# Patient Record
Sex: Female | Born: 1990 | Race: White | Hispanic: No | Marital: Married | Smoking: Never smoker
Health system: Southern US, Community
[De-identification: ages and names within clinical notes are randomized; demographics above are authoritative.]

## PROBLEM LIST (undated history)

## (undated) DIAGNOSIS — Z789 Other specified health status: Secondary | ICD-10-CM

## (undated) HISTORY — PX: KNEE SURGERY: SHX244

## (undated) HISTORY — PX: ANTERIOR CRUCIATE LIGAMENT REPAIR: SHX115

---

## 2013-11-15 ENCOUNTER — Emergency Department (HOSPITAL_COMMUNITY)
Admission: EM | Admit: 2013-11-15 | Discharge: 2013-11-15 | Disposition: A | Discharge status: Home / Self Care | Payer: BC Managed Care – PPO | Source: Home / Self Care | Attending: Emergency Medicine | Admitting: Emergency Medicine

## 2013-11-15 ENCOUNTER — Encounter (HOSPITAL_COMMUNITY): Payer: Self-pay | Admitting: Emergency Medicine

## 2013-11-15 DIAGNOSIS — R197 Diarrhea, unspecified: Secondary | ICD-10-CM

## 2013-11-15 NOTE — ED Provider Notes (Signed)
Medical screening examination/treatment/procedure(s) were performed by resident physician or non-physician practitioner and as supervising physician I was immediately available for consultation/collaboration.  Erin Honig,Randal Buba MD     Charm RingsErin J Honig, MD 11/15/13 2128

## 2013-11-15 NOTE — ED Notes (Signed)
C/o diarrhea for 2 weeks.  Reports "something moving in stool" patient concerned she has a "tapeworm".  Patient concerned about a 3 pound weight loss in a week.  Abdominal pain for 2 weeks, sharp, brief "very quick " pain

## 2013-11-15 NOTE — Discharge Instructions (Signed)

## 2013-11-15 NOTE — ED Provider Notes (Addendum)
CSN: [id number]     Arrival date & time 11/15/13  1928 History   First MD Initiated Contact with Patient 11/15/13 2046     Chief Complaint  Patient presents with  . Diarrhea   (Consider location/radiation/quality/duration/timing/severity/associated sxs/prior Treatment) HPI Comments: 23 year old female presents for evaluation of diarrhea. For 2 weeks, she has had watery diarrhea times daily, and intermittent sharp brief abdominal pains. She finally decided to come in today because she states she saw something moving in her stool, like some sort of worm. She also has noted a 3 pound weight loss this week. She denies any blood in the diarrhea. She has no recent travel or unclean water sources. She has not traveled out of the country in 2 years. Denies any other symptoms. No vaginal discharge or pelvic pain.  No possibility of of pregnancy, she is abstinent. She does admit to increased stress with recently moving, she questions whether that may be causing her symptoms.   Patient is a 23 y.o. female presenting with diarrhea.  Diarrhea Associated symptoms: abdominal pain   Associated symptoms: no chills, no fever and no vomiting     History reviewed. No pertinent past medical history. Past Surgical History  Procedure Laterality Date  . Anterior cruciate ligament repair     No family history on file. History  Substance Use Topics  . Smoking status: Not on file  . Smokeless tobacco: Not on file  . Alcohol Use: Not on file   OB History   Grav Para Term Preterm Abortions TAB SAB Ect Mult Living                 Review of Systems  Constitutional: Positive for unexpected weight change. Negative for fever and chills.  Gastrointestinal: Positive for abdominal pain and diarrhea. Negative for nausea, vomiting and blood in stool.  All other systems reviewed and are negative.   Allergies  Review of patient's allergies indicates no known allergies.  Home Medications   Prior to Admission  medications   Medication Sig Start Date End Date Taking? Authorizing Provider  Multiple Vitamin (MULTIVITAMIN) tablet Take 1 tablet by mouth daily.   Yes Historical Provider, MD  Norethindrone Acet-Ethinyl Est (JUNEL 1/20 PO) Take by mouth.   Yes Historical Provider, MD   BP 123/74  Pulse 56  Temp(Src) 98.9 F (37.2 C) (Oral)  Resp 16  LMP 10/25/2013 Physical Exam  Nursing note and vitals reviewed. Constitutional: She is oriented to person, place, and time. Vital signs are normal. She appears well-developed and well-nourished. No distress.  HENT:  Head: Normocephalic and atraumatic.  Cardiovascular: Normal rate, regular rhythm and normal heart sounds.   Pulmonary/Chest: Effort normal. No respiratory distress.  Abdominal: Soft. Normal appearance and bowel sounds are normal. She exhibits no distension, no pulsatile liver, no fluid wave and no mass. There is no hepatosplenomegaly. There is no tenderness. There is no rigidity, no rebound, no guarding, no CVA tenderness, no tenderness at McBurney's point and negative Murphy's sign. No hernia.  Neurological: She is alert and oriented to person, place, and time. She has normal strength. Coordination normal.  Skin: Skin is warm and dry. No rash noted. She is not diaphoretic.  Psychiatric: She has a normal mood and affect. Judgment normal.    ED Course  Procedures (including critical care time) Labs Review Labs Reviewed - No data to display  Imaging Review No results found.   MDM   1. Diarrhea    Benign exam. Will send stool studies  prior to treating, she will bring the studies back to the lab tomorrow. Followup when necessary       Graylon GoodZachary H Bj Morlock, PA-C 11/15/13 2105   Labs came back positive for Salmonella, patient had Cipro called in. She'll followup as needed.  Graylon GoodZachary H Anjalee Cope, PA-C 11/22/13 [phone]

## 2013-11-17 LAB — GI PATHOGEN PANEL BY PCR, STOOL
C difficile toxin A/B: NEGATIVE
Campylobacter by PCR: NEGATIVE
Cryptosporidium by PCR: NEGATIVE
E COLI (ETEC) LT/ST: NEGATIVE
E COLI 0157 BY PCR: NEGATIVE
E coli (STEC): NEGATIVE
G lamblia by PCR: NEGATIVE
Norovirus GI/GII: NEGATIVE
Rotavirus A by PCR: NEGATIVE
SALMONELLA BY PCR: POSITIVE
SHIGELLA BY PCR: NEGATIVE

## 2013-11-17 LAB — FECAL LACTOFERRIN, QUANT: Fecal Lactoferrin: POSITIVE

## 2013-11-17 LAB — OVA AND PARASITE EXAMINATION: Ova and parasites: NONE SEEN

## 2013-11-20 ENCOUNTER — Telehealth (HOSPITAL_COMMUNITY): Payer: Self-pay | Admitting: *Deleted

## 2013-11-20 NOTE — ED Notes (Addendum)
GI Pathogen panel: pos. Salmonella, Fecal Lactoferrin pos., Ova and Parasite: neg., Stool culture pending. 8/23 Lab reviewed.  Message sent to Dr. Piedad Climes and Almedia Balls PA.  8/24  I called Dr. Lorenz Coaster at [facility] in ED and obtained order for Cipro 500 mg. po BID x 10 days #20.  I called pt. and left a message to call. Call 1. Deanna Maxwell 11/20/2013 Pt. called back.  Pt. verified x 2 and given results.  Pt. told she needs Cipro and how to take it.  Pt. wants Rx. [facility] on [address]  I told her I would call it in and it should be ready in about 1 hr.  I told her, it should clear up, but if worsening in any way to f/u at the ED, since we are closed for 1 week.  I told her I would be reporting it to the [facility] and thye would probably call her back.  Pt. voiced understanding. Rx. called to pharmacist.  DHHS form completed and faxed to the [facility] Department. Deanna Maxwell 11/20/2013

## 2013-11-22 NOTE — ED Provider Notes (Signed)
Medical screening examination/treatment/procedure(s) were performed by resident physician or non-physician practitioner and as supervising physician I was immediately available for consultation/collaboration.  Randal Buba, MD     Charm Rings, MD 11/22/13 [phone]

## 2013-12-25 ENCOUNTER — Emergency Department (INDEPENDENT_AMBULATORY_CARE_PROVIDER_SITE_OTHER)
Admission: EM | Admit: 2013-12-25 | Discharge: 2013-12-25 | Disposition: A | Discharge status: Home / Self Care | Payer: BC Managed Care – PPO | Source: Home / Self Care | Attending: Emergency Medicine | Admitting: Emergency Medicine

## 2013-12-25 ENCOUNTER — Encounter (HOSPITAL_COMMUNITY): Payer: Self-pay | Admitting: Emergency Medicine

## 2013-12-25 DIAGNOSIS — A020 Salmonella enteritis: Secondary | ICD-10-CM

## 2013-12-25 MED ORDER — CIPROFLOXACIN HCL 500 MG PO TABS: 500.0000 mg | ORAL_TABLET | Freq: Two times a day (BID) | ORAL | Status: DC

## 2013-12-25 NOTE — ED Notes (Signed)
Patient concerned about poss continued salmonella infection

## 2013-12-25 NOTE — ED Provider Notes (Signed)
  Chief Complaint   Abdominal Pain    History of Present Illness   Deanna Maxwell is a 23 year old female who was treated for abdominal pain and diarrhea around the end of August. Her stool culture grew grew out Salmonella. She's not sure where she got it from, but states she eats a lot of cucumbers and this may have been the source. She was contacted by the health department but never did respond to them. She was treated with 10 days of ciprofloxacin and then got better. Over the past 4 days she's again developed some sensation of tenesmus and some mucousy stools. She denies any fever, chills, generalized aching, headache, abdominal pain, cramping, nausea, or vomiting.  Review of Systems   Other than as noted above, the patient denies any of the following symptoms: Systemic:  No fevers, chills, or dizziness. GI:  Blood in stool or vomitus. GU:  No dysuria, frequency, or urgency.  PMFSH   Past medical history, family history, social history, meds, and allergies were reviewed.    Physical Exam     Vital signs:  BP 135/79  Pulse 58  Temp(Src) 98.7 F (37.1 C) (Oral)  Resp 16  SpO2 99% General:  Alert and oriented.  In no distress.  Skin warm and dry.  Good skin turgor, brisk capillary refill. ENT:  No scleral icterus, moist mucous membranes, no oral lesions, pharynx clear. Lungs:  Breath sounds clear and equal bilaterally.  No wheezes, rales, or rhonchi. Heart:  Rhythm regular, without extrasystoles.  No gallops or murmers. Abdomen:  Soft and nontender without organomegaly or mass. Bowel sounds are normally active. Skin: Clear, warm, and dry.  Good turgor.  Brisk capillary refill.   Assessment   The encounter diagnosis was Salmonella gastroenteritis.  She may never have gotten completely rid of the Salmonella, or she may have been reinfected. A repeat stool culture was obtained and will retreat with 15 days of Cipro, since the Salmonella was sensitive to this.  Plan   1.   Meds:  The following meds were prescribed:   Discharge Medication List as of 12/25/2013 12:29 PM    START taking these medications   Details  ciprofloxacin (CIPRO) 500 MG tablet Take 1 tablet (500 mg total) by mouth every 12 (twelve) hours., Starting 12/25/2013, Until Discontinued, Normal        2.  Patient Education/Counseling:  The patient was given appropriate handouts, self care instructions, and instructed in symptomatic relief. The patient was told to stay on clear liquids for the remainder of the day, then advance to a B.R.A.T. diet starting tomorrow.   3.  Follow up:  The patient was told to follow up here if no better in 2 to 3 days, or sooner if becoming worse in any way, and given some red flag symptoms such as persistent vomitng, high fever, severe abdominal pain, or any GI bleeding which would prompt immediate return.         Reuben Likes, MD 12/25/13 [phone]

## 2013-12-25 NOTE — Discharge Instructions (Signed)
Salmonella Gastroenteritis Salmonella gastroenteritis occurs when certain bacteria infect the intestines. People usually begin to feel ill within 72 hours after the infection occurs. The illness can last from 2 days to 2 weeks. Elderly and immunocompromised people are at the greatest risk of this infection. Most people recover completely. However, salmonella bacteria can spread from the intestines to the blood and other parts of the body. In rare cases, a person may develop reactive arthritis with pain in the joints, irritation of the eyes, and painful urination. CAUSES  Salmonella gastroenteritis usually occurs after eating food or drinking liquids that are contaminated with salmonella bacteria. Common causes of this contamination include:  Poor personal hygiene.  Poor kitchen hygiene.  Drinking polluted, standing water.  Contact with carriers of the bacteria. Reptiles are strongly associated with the bacteria, but other animals may carry the bacteria as well. SIGNS AND SYMPTOMS   Nausea.  Vomiting.  Abdominal pain or cramps.  Diarrhea, which may be bloody.  Fever.  Headache. DIAGNOSIS  Your health care provider will take your medical history and perform a physical exam. A blood or stool sample may also be taken and tested for the presence of salmonella bacteria. TREATMENT  Often, no treatment is needed. However, you will need to drink plenty of fluids to prevent dehydration. In severe cases, antibiotic medicines may be given to help shorten the illness. HOME CARE INSTRUCTIONS  Drink enough fluids to keep your urine clear or pale yellow. Until your diarrhea, nausea, or vomiting is under control, you should only drink clear liquids. Clear liquids are anything you can see through, such as water, broth, or non-caffeinated tea. Avoid:  Milk.  Fruit juice.  Alcohol.  Extremely hot or cold fluids.  If you do not have an appetite, do not force yourself to eat. However, you must  continue to drink fluids.  If you have an appetite, eat a normal diet unless your health care provider tells you differently.  Eat a variety of complex carbohydrates (rice, wheat, potatoes, bread), lean meats, yogurt, fruits, and vegetables.  Avoid high-fat foods because they are more difficult to digest.  If you are dehydrated, ask your health care provider for specific rehydration instructions. Signs of dehydration may include:  Severe thirst.  Dry lips and mouth.  Dizziness.  Dark urine.  Decreasing urine frequency and amount.  Confusion.  Rapid breathing or pulse.  If you were prescribed an antibiotic medicine, finish it all even if you start to feel better.  Take medicines only as directed by your health care provider. Antidiarrheal medicines are not recommended.  Keep all follow-up visits as directed by your health care provider. PREVENTION  To prevent future salmonella infections:  Handle meat, eggs, seafood, and poultry properly.  Wash your hands and counters thoroughly after handling or preparing meat, eggs, seafood, and poultry.  Always cook meat, eggs, seafood, and poultry thoroughly.  Wash your hands thoroughly after handling animals. SEEK IMMEDIATE MEDICAL CARE IF:   You are unable to keep fluids down.  You have persistent vomiting or diarrhea.  You have abdominal pain that increases or is concentrated in one small area (localized).  Your diarrhea contains increased blood or mucus.  You feel very weak, dizzy, thirsty, or you faint.  You lose a significant amount of weight. Your health care provider can tell you how much weight loss should concern you.  You have a fever. MAKE SURE YOU:   Understand these instructions.  Will watch your condition.  Will get help   right away if you are not doing well or get worse. Document Released: 03/13/2000 Document Revised: 07/31/2013 Document Reviewed: 05/14/2011 ExitCare Patient Information 2015 ExitCare,  LLC. This information is not intended to replace advice given to you by your health care provider. Make sure you discuss any questions you have with your health care provider.  

## 2014-01-05 ENCOUNTER — Emergency Department (HOSPITAL_COMMUNITY)
Admission: EM | Admit: 2014-01-05 | Discharge: 2014-01-05 | Disposition: A | Discharge status: Home / Self Care | Payer: BC Managed Care – PPO | Source: Home / Self Care | Attending: Family Medicine | Admitting: Family Medicine

## 2014-01-05 ENCOUNTER — Encounter (HOSPITAL_COMMUNITY): Payer: Self-pay | Admitting: Emergency Medicine

## 2014-01-05 DIAGNOSIS — R109 Unspecified abdominal pain: Secondary | ICD-10-CM

## 2014-01-05 DIAGNOSIS — R195 Other fecal abnormalities: Secondary | ICD-10-CM

## 2014-01-05 MED ORDER — DICYCLOMINE HCL 10 MG PO CAPS: 10.0000 mg | ORAL_CAPSULE | Freq: Three times a day (TID) | ORAL | Status: DC

## 2014-01-05 NOTE — ED Provider Notes (Signed)
Deanna Maxwell is a 23 y.o. female who presents to Urgent Care today for abdominal cramping and nausea. Patient was diagnosed with Salmonella on August 19 and treated with Cipro. She was seen again on September 28 and was again treated with Cipro. She continues the medication yet she continues to have abdominal cramping and intermittent diarrhea with mucus. She denies any fevers or chills or significant abdominal pain. The initial stool culture was positive for Salmonella sensitive to Cipro. The patient did not collect a second stool culture at the time of the initial visit but does bring in one today and a urine specimen cup.   History reviewed. No pertinent past medical history. History  Substance Use Topics  . Smoking status: Never Smoker   . Smokeless tobacco: Not on file  . Alcohol Use: Yes   ROS as above Medications: No current facility-administered medications for this encounter.   Current Outpatient Prescriptions  Medication Sig Dispense Refill  . ciprofloxacin (CIPRO) 500 MG tablet Take 1 tablet (500 mg total) by mouth every 12 (twelve) hours.  30 tablet  0  . dicyclomine (BENTYL) 10 MG capsule Take 1 capsule (10 mg total) by mouth 3 (three) times daily before meals.  120 capsule  0  . Multiple Vitamin (MULTIVITAMIN) tablet Take 1 tablet by mouth daily.      . Norethindrone Acet-Ethinyl Est (JUNEL 1/20 PO) Take by mouth.        Exam:  BP 133/68  Pulse 68  Temp(Src) 98.3 F (36.8 C) (Oral)  Resp 16  Ht 5\' 4"  (1.626 m)  Wt 129 lb (58.514 kg)  BMI 22.13 kg/m2  SpO2 100%  LMP 12/22/2013 Gen: Well NAD HEENT: EOMI,  MMM Lungs: Normal work of breathing. CTABL Heart: RRR no MRG Abd: NABS, Soft. Nondistended, Nontender no rebound or guarding Exts: Brisk capillary refill, warm and well perfused.   No results found for this or any previous visit (from the past 24 hour(s)). No results found.  Assessment and Plan: 23 y.o. female with continued abdominal cramping and  discomfort. I am doubtful this is continued Salmonella. Plan to re-obtain a stool culture. I suspect IBS or possibly inflammatory bowel disease. Refer to gastroenterology. Treatment with dicyclomine  Discussed warning signs or symptoms. Please see discharge instructions. Patient expresses understanding.     Rodolph BongEvan S Brittney Caraway, MD 01/05/14 [phone]

## 2014-01-05 NOTE — Discharge Instructions (Signed)
Thank you for coming in today. Take dicyclomine up to 3 times daily as needed for abdominal cramping. [facility] and [facility]. If your belly pain worsens, or you have high fever, bad vomiting, blood in your stool or black tarry stool go to the Emergency Room.    Abdominal Pain, Women Abdominal (stomach, pelvic, or belly) pain can be caused by many things. It is important to tell your doctor:  The location of the pain.  Does it come and go or is it present all the time?  Are there things that start the pain (eating certain foods, exercise)?  Are there other symptoms associated with the pain (fever, nausea, vomiting, diarrhea)? All of this is helpful to know when trying to find the cause of the pain. CAUSES   Stomach: virus or bacteria infection, or ulcer.  Intestine: appendicitis (inflamed appendix), regional ileitis (Crohn's disease), ulcerative colitis (inflamed colon), irritable bowel syndrome, diverticulitis (inflamed diverticulum of the colon), or cancer of the stomach or intestine.  Gallbladder disease or stones in the gallbladder.  Kidney disease, kidney stones, or infection.  Pancreas infection or cancer.  Fibromyalgia (pain disorder).  Diseases of the female organs:  Uterus: fibroid (non-cancerous) tumors or infection.  Fallopian tubes: infection or tubal pregnancy.  Ovary: cysts or tumors.  Pelvic adhesions (scar tissue).  Endometriosis (uterus lining tissue growing in the pelvis and on the pelvic organs).  Pelvic congestion syndrome (female organs filling up with blood just before the menstrual period).  Pain with the menstrual period.  Pain with ovulation (producing an egg).  Pain with an IUD (intrauterine device, birth control) in the uterus.  Cancer of the female organs.  Functional pain (pain not caused by a disease, may improve without treatment).  Psychological pain.  Depression. DIAGNOSIS  Your doctor  will decide the seriousness of your pain by doing an examination.  Blood tests.  X-rays.  Ultrasound.  CT scan (computed tomography, special type of X-ray).  MRI (magnetic resonance imaging).  Cultures, for infection.  Barium enema (dye inserted in the large intestine, to better view it with X-rays).  Colonoscopy (looking in intestine with a lighted tube).  Laparoscopy (minor surgery, looking in abdomen with a lighted tube).  Major abdominal exploratory surgery (looking in abdomen with a large incision). TREATMENT  The treatment will depend on the cause of the pain.   Many cases can be observed and treated at home.  Over-the-counter medicines recommended by your caregiver.  Prescription medicine.  Antibiotics, for infection.  Birth control pills, for painful periods or for ovulation pain.  Hormone treatment, for endometriosis.  Nerve blocking injections.  Physical therapy.  Antidepressants.  Counseling with a psychologist or psychiatrist.  Minor or major surgery. HOME CARE INSTRUCTIONS   Do not take laxatives, unless directed by your caregiver.  Take over-the-counter pain medicine only if ordered by your caregiver. Do not take aspirin because it can cause an upset stomach or bleeding.  Try a clear liquid diet (broth or water) as ordered by your caregiver. Slowly move to a bland diet, as tolerated, if the pain is related to the stomach or intestine.  Have a thermometer and take your temperature several times a day, and record it.  Bed rest and sleep, if it helps the pain.  Avoid sexual intercourse, if it causes pain.  Avoid stressful situations.  Keep your follow-up appointments and tests, as your caregiver orders.  If the pain does not go away with medicine or surgery, you  may try:  Acupuncture.  Relaxation exercises (yoga, meditation).  Group therapy.  Counseling. SEEK MEDICAL CARE IF:   You notice certain foods cause stomach pain.  Your  home care treatment is not helping your pain.  You need stronger pain medicine.  You want your IUD removed.  You feel faint or lightheaded.  You develop nausea and vomiting.  You develop a rash.  You are having side effects or an allergy to your medicine. SEEK IMMEDIATE MEDICAL CARE IF:   Your pain does not go away or gets worse.  You have a fever.  Your pain is felt only in portions of the abdomen. The right side could possibly be appendicitis. The left lower portion of the abdomen could be colitis or diverticulitis.  You are passing blood in your stools (bright red or black tarry stools, with or without vomiting).  You have blood in your urine.  You develop chills, with or without a fever.  You pass out. MAKE SURE YOU:   Understand these instructions.  Will watch your condition.  Will get help right away if you are not doing well or get worse. Document Released: 01/11/2007 Document Revised: 07/31/2013 Document Reviewed: 01/31/2009 [facility] Patient Information 2015 [place], [place]. This information is not intended to replace advice given to you by your health care provider. Make sure you discuss any questions you have with your health care provider.  PRIMARY CARE Merchant navy officerDOCTORS  [facility] at [facility] [address]  [place], [place] [phone]  Fax [phone]  [facility] at [facility] [address]  [place], [place] [phone]  Fax [phone]  [facility] at [place] / [place] [id number]  [place], [place] [phone]  Fax [phone]  [facility] at [facility] [address], [address]  [place], [place] [phone]  Fax [phone]  [facility] [address]  [place], [place] [phone]  Fax [phone]  [facility] at [facility] [address]  [place], [place] [phone]  Fax [phone]   [facility] @ [place] [address] [place] [address] Phone: [phone]   [facility] @ [facility] [address] [place] [address] Phone: [phone]   [facility] @ [place] [address] [place] [address] Phone: [phone]   [facility] @ [place] [address] [place] [address] Phone: [phone]   [facility] @ [place] [address] [place] [address] Phone: [phone] Fax: [phone]   [facility] @ [place] [address] [place] [address] Phone: [phone]   Dr. Maryelizabeth RowanElizabeth Dewey [address] [place] [address] [phone]

## 2014-01-05 NOTE — ED Notes (Signed)
Coming in for persistent abd pain; Seen here on 8/19 and 9/28 Dx w/Solmenella; given cipro w/no relief Sx today include: HA, nauseas, LH, decreased appetite Denies f/v/d Alert, no signs of acute distress.

## 2014-01-09 ENCOUNTER — Encounter: Payer: Self-pay | Admitting: Gastroenterology

## 2014-01-09 LAB — STOOL CULTURE: Special Requests: NORMAL

## 2014-01-16 LAB — STOOL CULTURE

## 2014-01-16 NOTE — ED Notes (Signed)
Stool culture final: Salmonella Enteritiditis. Pt. notified and treated 11/20/2013

## 2014-02-15 ENCOUNTER — Other Ambulatory Visit: Payer: Self-pay | Admitting: Family Medicine

## 2014-02-15 DIAGNOSIS — R109 Unspecified abdominal pain: Secondary | ICD-10-CM

## 2014-02-16 ENCOUNTER — Other Ambulatory Visit: Payer: Self-pay | Admitting: Family Medicine

## 2014-02-16 DIAGNOSIS — R1010 Upper abdominal pain, unspecified: Secondary | ICD-10-CM

## 2014-02-19 ENCOUNTER — Ambulatory Visit
Admission: RE | Admit: 2014-02-19 | Discharge: 2014-02-19 | Disposition: A | Discharge status: Home / Self Care | Payer: BC Managed Care – PPO | Source: Ambulatory Visit | Attending: Family Medicine | Admitting: Family Medicine

## 2014-02-19 ENCOUNTER — Other Ambulatory Visit: Payer: BC Managed Care – PPO

## 2014-02-19 DIAGNOSIS — R1010 Upper abdominal pain, unspecified: Secondary | ICD-10-CM

## 2014-02-19 DIAGNOSIS — R109 Unspecified abdominal pain: Secondary | ICD-10-CM

## 2014-03-13 ENCOUNTER — Ambulatory Visit: Payer: BC Managed Care – PPO | Admitting: Gastroenterology

## 2017-06-28 ENCOUNTER — Other Ambulatory Visit: Payer: Self-pay

## 2017-06-28 ENCOUNTER — Ambulatory Visit (HOSPITAL_COMMUNITY)
Admission: EM | Admit: 2017-06-28 | Discharge: 2017-06-28 | Disposition: A | Discharge status: Home / Self Care | Payer: BLUE CROSS/BLUE SHIELD | Attending: Family Medicine | Admitting: Family Medicine

## 2017-06-28 ENCOUNTER — Encounter (HOSPITAL_COMMUNITY): Payer: Self-pay | Admitting: Emergency Medicine

## 2017-06-28 DIAGNOSIS — J0100 Acute maxillary sinusitis, unspecified: Secondary | ICD-10-CM

## 2017-06-28 MED ORDER — AMOXICILLIN-POT CLAVULANATE 875-125 MG PO TABS: 1.0000 | ORAL_TABLET | Freq: Two times a day (BID) | ORAL | 0 refills | Status: AC

## 2017-06-28 NOTE — ED Provider Notes (Signed)
[facility]    CSN: [id number] Arrival date & time: 06/28/17  1810     History   Chief Complaint Chief Complaint  Patient presents with  . URI    HPI Deanna Maxwell is a 27 y.o. female.   Deanna Maxwell presents with complaints of worsening facial pressure and congestion which started two weeks ago and is worse today. Has been taking otc sinus medications which had been helping some. No known fevers. Denies sore throat or ear pain. Mild nausea. Pressure to face, forehead, posterior head. Symptoms are worse with laying and with movement. Without contributing medical history.   ROS per HPI.      History reviewed. No pertinent past medical history.  There are no active problems to display for this patient.   Past Surgical History:  Procedure Laterality Date  . ANTERIOR CRUCIATE LIGAMENT REPAIR    . KNEE SURGERY      OB History   None      Home Medications    Prior to Admission medications   Medication Sig Start Date End Date Taking? Authorizing Provider  amoxicillin-clavulanate (AUGMENTIN) 875-125 MG tablet Take 1 tablet by mouth every 12 (twelve) hours for 10 days. 06/28/17 07/08/17  Georgetta Haber, NP  dicyclomine (BENTYL) 10 MG capsule Take 1 capsule (10 mg total) by mouth 3 (three) times daily before meals. 01/05/14   Rodolph Bong, MD  Multiple Vitamin (MULTIVITAMIN) tablet Take 1 tablet by mouth daily.    [provider]    Family History Family History  Problem Relation Age of Onset  . Healthy Mother     Social History Social History   Tobacco Use  . Smoking status: Never Smoker  Substance Use Topics  . Alcohol use: Yes  . Drug use: Never     Allergies   Patient has no known allergies.   Review of Systems Review of Systems   Physical Exam Triage Vital Signs ED Triage Vitals  Enc Vitals Group     BP 06/28/17 1918 130/87     Pulse Rate 06/28/17 1918 60     Resp 06/28/17 1918 20     Temp 06/28/17 1918 (!) 97.2 F  (36.2 C)     Temp Source 06/28/17 1918 Oral     SpO2 06/28/17 1918 100 %     Weight --      Height --      Head Circumference --      Peak Flow --      Pain Score 06/28/17 1916 2     Pain Loc --      Pain Edu? --      Excl. in GC? --    No data found.  Updated Vital Signs BP 130/87 (BP Location: Left Arm)   Pulse 60   Temp (!) 97.2 F (36.2 C) (Oral)   Resp 20   LMP 06/22/2017   SpO2 100%   Visual Acuity Right Eye Distance:   Left Eye Distance:   Bilateral Distance:    Right Eye Near:   Left Eye Near:    Bilateral Near:     Physical Exam  Constitutional: She is oriented to person, place, and time. She appears well-developed and well-nourished. No distress.  HENT:  Head: Normocephalic and atraumatic.  Right Ear: Tympanic membrane, external ear and ear canal normal.  Left Ear: Tympanic membrane, external ear and ear canal normal.  Nose: Mucosal edema present. Right sinus exhibits maxillary sinus tenderness. Right sinus exhibits no  frontal sinus tenderness. Left sinus exhibits maxillary sinus tenderness. Left sinus exhibits no frontal sinus tenderness.  Mouth/Throat: Uvula is midline, oropharynx is clear and moist and mucous membranes are normal. No tonsillar exudate.  Eyes: Pupils are equal, round, and reactive to light. Conjunctivae and EOM are normal.  Cardiovascular: Normal rate, regular rhythm and normal heart sounds.  Pulmonary/Chest: Effort normal and breath sounds normal.  Neurological: She is alert and oriented to person, place, and time.  Skin: Skin is warm and dry.     UC Treatments / Results  Labs (all labs ordered are listed, but only abnormal results are displayed) Labs Reviewed - No data to display  EKG None Radiology No results found.  Procedures Procedures (including critical care time)  Medications Ordered in UC Medications - No data to display   Initial Impression / Assessment and Plan / UC Course  I have reviewed the triage vital  signs and the nursing notes.  Pertinent labs & imaging results that were available during my care of the patient were reviewed by me and considered in my medical decision making (see chart for details).     Complete course of antibiotics.  Push fluids to ensure adequate hydration and keep secretions thin.  Tylenol and/or ibuprofen as needed for pain or fevers.  Return precautions provided. Patient verbalized understanding and agreeable to plan.    Final Clinical Impressions(s) / UC Diagnoses   Final diagnoses:  Acute non-recurrent maxillary sinusitis    ED Discharge Orders        Ordered    amoxicillin-clavulanate (AUGMENTIN) 875-125 MG tablet  Every 12 hours     06/28/17 1942       Controlled Substance Prescriptions [place] Controlled Substance Registry consulted? Not Applicable   Georgetta HaberBurky, Natalie B, NP 06/28/17 [phone]

## 2017-06-28 NOTE — Discharge Instructions (Signed)
Push fluids to ensure adequate hydration and keep secretions thin.  Tylenol and/or ibuprofen as needed for pain or fevers.  Complete course of antibiotics.  May continue with over the counter treatments as needed for symptoms.  If symptoms worsen or do not improve in the next week to return to be seen or to follow up with your PCP.

## 2017-06-28 NOTE — ED Triage Notes (Signed)
Onset of symptoms over the past 2 weeks.  Thinks sinus pressure related to weather changes.  Intermittent light headed.

## 2017-09-26 ENCOUNTER — Ambulatory Visit (HOSPITAL_COMMUNITY)
Admission: EM | Admit: 2017-09-26 | Discharge: 2017-09-26 | Disposition: A | Discharge status: Home / Self Care | Payer: BLUE CROSS/BLUE SHIELD | Attending: Family Medicine | Admitting: Family Medicine

## 2017-09-26 ENCOUNTER — Encounter (HOSPITAL_COMMUNITY): Payer: Self-pay | Admitting: Emergency Medicine

## 2017-09-26 DIAGNOSIS — R079 Chest pain, unspecified: Secondary | ICD-10-CM

## 2017-09-26 NOTE — Discharge Instructions (Signed)
Rest Limit caffeine Take some ibuprofen for back pain, see if this helps Return promptly for any worsening or change in the pain

## 2017-09-26 NOTE — ED Triage Notes (Signed)
Pt c/o center chest pain, breathing makes it worse. Turning to the side also makes it worse.

## 2017-09-26 NOTE — ED Provider Notes (Signed)
[facility]    CSN: [id number] Arrival date & time: 09/26/17  1231     History   Chief Complaint Chief Complaint  Patient presents with  . Chest Pain    HPI Stewart Pimenta is a 27 y.o. female.   HPI Chest pain started at 10:30 last night.  Pressure in the center and left of the chest.  NO shortness of breath or diaphoresis, vague dizziness, no palpitations.  No radiation.  NO trauma.  NO GERD or nausea/vomiting/heartburn.  Usually healthy.  NO HTN, cholesterol, DM or smoking.  Family history of heart disease on fathers side of family, in older folks.  No heart history or murmur in past. No recent illness or cough.  No underlying asthma or allergies No trauma or heavy lifting She denies emotional stress, however appears anxious and is emotionally labile with frequent tearfulness during our interaction. History reviewed. No pertinent past medical history.  There are no active problems to display for this patient.   Past Surgical History:  Procedure Laterality Date  . ANTERIOR CRUCIATE LIGAMENT REPAIR    . KNEE SURGERY      OB History   None      Home Medications    Prior to Admission medications   Medication Sig Start Date End Date Taking? Authorizing Provider  dicyclomine (BENTYL) 10 MG capsule Take 1 capsule (10 mg total) by mouth 3 (three) times daily before meals. 01/05/14   Rodolph Bong, MD  Multiple Vitamin (MULTIVITAMIN) tablet Take 1 tablet by mouth daily.    [provider]    Family History Family History  Problem Relation Age of Onset  . Healthy Mother   . Cancer Father   . Hypertension Father   . Heart disease Other     Social History Social History   Tobacco Use  . Smoking status: Never Smoker  Substance Use Topics  . Alcohol use: Yes  . Drug use: Never     Allergies   Patient has no known allergies.   Review of Systems Review of Systems  Constitutional: Negative for chills, fatigue and fever.  HENT: Negative  for ear pain and sore throat.   Eyes: Negative for pain and visual disturbance.  Respiratory: Negative for cough and shortness of breath.   Cardiovascular: Positive for chest pain. Negative for palpitations and leg swelling.  Gastrointestinal: Negative for abdominal pain, nausea and vomiting.  Genitourinary: Negative for dysuria and hematuria.  Musculoskeletal: Negative for arthralgias and back pain.  Skin: Negative for color change and rash.  Neurological: Negative for seizures and syncope.  Psychiatric/Behavioral: Negative for dysphoric mood. The patient is nervous/anxious.        Has had mild anxiety but not recently.  Denies stress  All other systems reviewed and are negative.    Physical Exam Triage Vital Signs ED Triage Vitals  Enc Vitals Group     BP 09/26/17 1324 124/69     Pulse Rate 09/26/17 1324 93     Resp 09/26/17 1324 18     Temp 09/26/17 1324 98.5 F (36.9 C)     Temp src --      SpO2 09/26/17 1324 100 %     Weight --      Height --      Head Circumference --      Peak Flow --      Pain Score 09/26/17 1325 5     Pain Loc --      Pain Edu? --  Excl. in GC? --    No data found.  Updated Vital Signs BP 124/69   Pulse 93   Temp 98.5 F (36.9 C)   Resp 18   LMP 09/23/2017   SpO2 100%      Physical Exam  Constitutional: She appears well-developed and well-nourished. No distress.  HENT:  Head: Normocephalic and atraumatic.  Mouth/Throat: Oropharynx is clear and moist.  Eyes: Pupils are equal, round, and reactive to light. Conjunctivae are normal.  Neck: Normal range of motion.  Cardiovascular: Normal rate and normal pulses. An irregularly irregular rhythm present.    No systolic murmur is present. Pulmonary/Chest: Effort normal and breath sounds normal. No respiratory distress.  No chest wall tenderness  Abdominal: Soft. Bowel sounds are normal. She exhibits no distension. There is no tenderness.  Musculoskeletal: Normal range of motion. She  exhibits no edema.       Right lower leg: Normal.       Left lower leg: Normal.  Neurological: She is alert.  Skin: Skin is warm and dry.  Psychiatric: Her behavior is normal. Her affect is labile.     UC Treatments / Results  Labs (all labs ordered are listed, but only abnormal results are displayed) Labs Reviewed - No data to display  EKG None  Radiology No results found.  Procedures Procedures (including critical care time)  Medications Ordered in UC Medications - No data to display  Initial Impression / Assessment and Plan / UC Course  I have reviewed the triage vital signs and the nursing notes.  Pertinent labs & imaging results that were available during my care of the patient were reviewed by me and considered in my medical decision making (see chart for details).     Discussed with patient and her husband the differential diagnosis of chest pain.  Her pain does not have any history or examination features of heart related pain.  Lungs are clear.  No lung disease.  No chest wall pain.  She does have some history of thoracic back pain that is muscular in nature.  This could contribute.  I believe her anxiety and stress are contributory. Final Clinical Impressions(s) / UC Diagnoses   Final diagnoses:  Chest pain, unspecified type     Discharge Instructions     Rest Limit caffeine Take some ibuprofen for back pain, see if this helps Return promptly for any worsening or change in the pain    ED Prescriptions    None     Controlled Substance Prescriptions [place] Controlled Substance Registry consulted? Not Applicable   Eustace MooreNelson, Aaleeyah Bias Sue, MD 09/26/17 2206

## 2017-10-04 ENCOUNTER — Encounter (HOSPITAL_COMMUNITY): Payer: Self-pay | Admitting: Emergency Medicine

## 2017-10-04 ENCOUNTER — Emergency Department (HOSPITAL_COMMUNITY): Payer: BLUE CROSS/BLUE SHIELD

## 2017-10-04 ENCOUNTER — Emergency Department (HOSPITAL_COMMUNITY)
Admission: EM | Admit: 2017-10-04 | Discharge: 2017-10-04 | Disposition: A | Discharge status: Home / Self Care | Payer: BLUE CROSS/BLUE SHIELD | Attending: Emergency Medicine | Admitting: Emergency Medicine

## 2017-10-04 DIAGNOSIS — Z79899 Other long term (current) drug therapy: Secondary | ICD-10-CM | POA: Insufficient documentation

## 2017-10-04 DIAGNOSIS — R079 Chest pain, unspecified: Secondary | ICD-10-CM | POA: Diagnosis present

## 2017-10-04 DIAGNOSIS — R1012 Left upper quadrant pain: Secondary | ICD-10-CM | POA: Insufficient documentation

## 2017-10-04 DIAGNOSIS — R0789 Other chest pain: Secondary | ICD-10-CM | POA: Insufficient documentation

## 2017-10-04 LAB — BASIC METABOLIC PANEL
Anion gap: 8 (ref 5–15)
BUN: 8 mg/dL (ref 6–20)
CO2: 24 mmol/L (ref 22–32)
CREATININE: 0.84 mg/dL (ref 0.44–1.00)
Calcium: 9.5 mg/dL (ref 8.9–10.3)
Chloride: 108 mmol/L (ref 98–111)
GFR calc non Af Amer: >60 mL/min (ref >60)
Glucose, Bld: 113 mg/dL — ABNORMAL HIGH (ref 70–99)
Potassium: 3.6 mmol/L (ref 3.5–5.1)
Sodium: 140 mmol/L (ref 135–145)

## 2017-10-04 LAB — HEPATIC FUNCTION PANEL
ALT: 14 U/L (ref 0–44)
AST: 21 U/L (ref 15–41)
Albumin: 3.8 g/dL (ref 3.5–5.0)
Alkaline Phosphatase: 40 U/L (ref 38–126)
BILIRUBIN INDIRECT: 0.4 mg/dL (ref 0.3–0.9)
BILIRUBIN TOTAL: 0.5 mg/dL (ref 0.3–1.2)
Bilirubin, Direct: 0.1 mg/dL (ref 0.0–0.2)
Total Protein: 7.2 g/dL (ref 6.5–8.1)

## 2017-10-04 LAB — CBC
HCT: 36.4 % (ref 36.0–46.0)
Hemoglobin: 12.2 g/dL (ref 12.0–15.0)
MCH: 31.5 pg (ref 26.0–34.0)
MCHC: 33.5 g/dL (ref 30.0–36.0)
MCV: 94.1 fL (ref 78.0–100.0)
PLATELETS: 278 10*3/uL (ref 150–400)
RBC: 3.87 MIL/uL (ref 3.87–5.11)
RDW: 11.9 % (ref 11.5–15.5)
WBC: 7.3 10*3/uL (ref 4.0–10.5)

## 2017-10-04 LAB — I-STAT TROPONIN, ED
TROPONIN I, POC: 0 ng/mL (ref 0.00–0.08)
Troponin i, poc: 0 ng/mL (ref 0.00–0.08)

## 2017-10-04 LAB — I-STAT BETA HCG BLOOD, ED (MC, WL, AP ONLY): I-stat hCG, quantitative: <5 m[IU]/mL (ref <5)

## 2017-10-04 LAB — LIPASE, BLOOD: Lipase: 36 U/L (ref 11–51)

## 2017-10-04 MED ORDER — GI COCKTAIL ~~LOC~~
30.0000 mL | Freq: Once | ORAL | Status: AC
  Administered 2017-10-04: 30 mL via ORAL
  Filled 2017-10-04: qty 30

## 2017-10-04 MED ORDER — KETOROLAC TROMETHAMINE 60 MG/2ML IM SOLN
30.0000 mg | Freq: Once | INTRAMUSCULAR | Status: AC
  Administered 2017-10-04: 30 mg via INTRAMUSCULAR
  Filled 2017-10-04: qty 2

## 2017-10-04 NOTE — ED Triage Notes (Signed)
Pt reports L sided CP with radiation down L arm onset 1 week ago. Intermittent, sharp, 6/10. [facility] and was told to come here if pain not any better.

## 2017-10-04 NOTE — ED Notes (Signed)
Called lab and added on lipase and LFTs

## 2017-10-04 NOTE — ED Notes (Signed)
Family at bedside. 

## 2017-10-04 NOTE — Discharge Instructions (Addendum)
You may use over-the-counter Motrin (Ibuprofen), Acetaminophen (Tylenol), topical muscle creams such as SalonPas, Icy Hot, Bengay, etc. Please stretch, apply heat, and have massage therapy for additional assistance. ° °

## 2017-10-04 NOTE — ED Notes (Signed)
Pt understood dc material. NAD noted. 

## 2017-10-04 NOTE — ED Provider Notes (Signed)
[facility] EMERGENCY DEPARTMENT Provider Note  CSN: [id number] Arrival date & time: 10/04/17 0105  Chief Complaint(s) Chest Pain  HPI Deanna Maxwell is a 27 y.o. female who presents to the emergency department with 1 week of intermittent left sided parasternal chest pain described as pressure-like.  Pain began 1 week ago; improved after of couple days.  He returned last night approximately 6 hours prior to arrival.  Pain now also involves sharp component.  She reports that the pain now radiates to the left arm.  She is also endorsing left upper quadrant abdominal discomfort.  States that she has been taking NSAIDs for back spasms.  Denies any trauma.  Denies any recent fevers or infections.  No coughing.  No nausea or vomiting.  Denies any recent travel or prolonged immobilization.  HPI  Past Medical History History reviewed. No pertinent past medical history. There are no active problems to display for this patient.  Home Medication(s) Prior to Admission medications   Medication Sig Start Date End Date Taking? Authorizing Provider  dicyclomine (BENTYL) 10 MG capsule Take 1 capsule (10 mg total) by mouth 3 (three) times daily before meals. 01/05/14   Rodolph Bong, MD  Multiple Vitamin (MULTIVITAMIN) tablet Take 1 tablet by mouth daily.    [provider]                                                                                                                                    Past Surgical History Past Surgical History:  Procedure Laterality Date  . ANTERIOR CRUCIATE LIGAMENT REPAIR    . KNEE SURGERY     Family History Family History  Problem Relation Age of Onset  . Healthy Mother   . Cancer Father   . Hypertension Father   . Heart disease Other     Social History Social History   Tobacco Use  . Smoking status: Never Smoker  Substance Use Topics  . Alcohol use: Yes  . Drug use: Never   Allergies Patient has no known  allergies.  Review of Systems Review of Systems All other systems are reviewed and are negative for acute change except as noted in the HPI  Physical Exam Vital Signs  I have reviewed the triage vital signs BP 119/69   Pulse (!) 51   Temp 98.1 F (36.7 C) (Oral)   Resp 18   Ht 5\' 3"  (1.6 m)   Wt 70.3 kg (155 lb)   LMP 09/23/2017   SpO2 99%   BMI 27.46 kg/m   Physical Exam  Constitutional: She is oriented to person, place, and time. She appears well-developed and well-nourished. No distress.  HENT:  Head: Normocephalic and atraumatic.  Nose: Nose normal.  Eyes: Pupils are equal, round, and reactive to light. Conjunctivae and EOM are normal. Right eye exhibits no discharge. Left eye exhibits no discharge. No scleral icterus.  Neck: Normal range of  motion. Neck supple.  Cardiovascular: Normal rate and regular rhythm. Exam reveals no gallop and no friction rub.  No murmur heard. Pulmonary/Chest: Effort normal and breath sounds normal. No stridor. No respiratory distress. She has no rales. She exhibits tenderness.    Abdominal: Soft. She exhibits no distension. There is tenderness (discomfort) in the left upper quadrant. There is no rigidity, no rebound and no guarding.  Musculoskeletal: She exhibits no edema or tenderness.  Neurological: She is alert and oriented to person, place, and time.  Skin: Skin is warm and dry. No rash noted. She is not diaphoretic. No erythema.  Psychiatric: She has a normal mood and affect.  Vitals reviewed.   ED Results and Treatments Labs (all labs ordered are listed, but only abnormal results are displayed) Labs Reviewed  BASIC METABOLIC PANEL - Abnormal; Notable for the following components:      Result Value   Glucose, Bld 113 (*)    All other components within normal limits  CBC  HEPATIC FUNCTION PANEL  LIPASE, BLOOD  I-STAT TROPONIN, ED  I-STAT BETA HCG BLOOD, ED ([facility], [facility], [facility] ONLY)  I-STAT TROPONIN, ED                                                                                                                          EKG  EKG Interpretation  Date/Time:  Monday October 04 2017 01:20:32 EDT Ventricular Rate:  72 PR Interval:  92 QRS Duration: 82 QT Interval:  380 QTC Calculation: 416 R Axis:   96 Text Interpretation:  ** Suspect arm lead reversal, interpretation assumes no reversal Sinus rhythm with sinus arrhythmia with short PR Rightward axis Nonspecific T wave abnormality Abnormal ECG NO STEMI Confirmed by Drema Pryardama, Pedro [phone]([id number]) on 10/04/2017 2:49:19 AM      Radiology Dg Chest 2 View  Result Date: 10/04/2017 CLINICAL DATA:  Left-sided chest pain. EXAM: CHEST - 2 VIEW COMPARISON:  Radiographs 02/19/2014 FINDINGS: The cardiomediastinal contours are normal. The lungs are clear. Pulmonary vasculature is normal. No consolidation, pleural effusion, or pneumothorax. No acute osseous abnormalities are seen. IMPRESSION: Negative radiographs of the chest. Electronically Signed   By: Rubye OaksMelanie  Ehinger M.D.   On: 10/04/2017 01:59   Pertinent labs & imaging results that were available during my care of the patient were reviewed by me and considered in my medical decision making (see chart for details).  Medications Ordered in ED Medications  gi cocktail (Maalox,Lidocaine,Donnatal) (30 mLs Oral Given 10/04/17 0400)  ketorolac (TORADOL) injection 30 mg (30 mg Intramuscular Given 10/04/17 0527)  Procedures Procedures  (including critical care time)  Medical Decision Making / ED Course I have reviewed the nursing notes for this encounter and the patient's prior records (if available in EHR or on provided paperwork).    Atypical chest pain most consistent with chest wall pain.  Low suspicion for ACS.  EKG without acute ischemic changes or evidence of pericarditis.  Initial troponin negative.  Heart score  less than 4.  Delta troponin negative.  Low suspicion for pulmonary embolism.  Presentation not classic for aortic dissection or esophageal perforation.  Chest x-ray without evidence suggestive of pneumonia, pneumothorax, pneumomediastinum.  No abnormal contour of the mediastinum to suggest dissection. No evidence of acute injuries.  Left upper quadrant abdominal discomfort.  Labs grossly reassuring without leukocytosis, biliary obstruction or evidence of pancreatitis.  hCG negative.  Doubt serious intra-abdominal inflammatory/infectious process requiring advanced imaging at this time.  Tolerating oral intake.  The patient appears reasonably screened and/or stabilized for discharge and I doubt any other medical condition or other [facility] requiring further screening, evaluation, or treatment in the ED at this time prior to discharge.  The patient is safe for discharge with strict return precautions.     Final Clinical Impression(s) / ED Diagnoses Final diagnoses:  Chest wall pain  LUQ pain    Disposition: Discharge  Condition: Good  I have discussed the results, Dx and Tx plan with the patient who expressed understanding and agree(s) with the plan. Discharge instructions discussed at great length. The patient was given strict return precautions who verbalized understanding of the instructions. No further questions at time of discharge.    ED Discharge Orders    None       Follow Up: Lewis Moccasin, MD [address] [place] [address] [phone]  Schedule an appointment as soon as possible for a visit  in 5-7 days, If symptoms do not improve or  worsen     This chart was dictated using voice recognition software.  Despite best efforts to proofread,  errors can occur which can change the documentation meaning.   Nira Conn, MD 10/04/17 [phone]

## 2020-03-26 ENCOUNTER — Other Ambulatory Visit: Payer: Self-pay

## 2020-03-26 ENCOUNTER — Encounter (HOSPITAL_COMMUNITY): Payer: Self-pay | Admitting: Obstetrics & Gynecology

## 2020-03-26 ENCOUNTER — Inpatient Hospital Stay (HOSPITAL_COMMUNITY): Payer: 59

## 2020-03-26 ENCOUNTER — Inpatient Hospital Stay (HOSPITAL_COMMUNITY)
Admission: AD | Admit: 2020-03-26 | Discharge: 2020-03-26 | Disposition: A | Discharge status: Home / Self Care | Payer: 59 | Attending: Obstetrics & Gynecology | Admitting: Obstetrics & Gynecology

## 2020-03-26 DIAGNOSIS — O020 Blighted ovum and nonhydatidiform mole: Secondary | ICD-10-CM | POA: Diagnosis not present

## 2020-03-26 DIAGNOSIS — O26891 Other specified pregnancy related conditions, first trimester: Secondary | ICD-10-CM | POA: Insufficient documentation

## 2020-03-26 DIAGNOSIS — R1031 Right lower quadrant pain: Secondary | ICD-10-CM | POA: Insufficient documentation

## 2020-03-26 DIAGNOSIS — O26899 Other specified pregnancy related conditions, unspecified trimester: Secondary | ICD-10-CM

## 2020-03-26 DIAGNOSIS — Z3A01 Less than 8 weeks gestation of pregnancy: Secondary | ICD-10-CM | POA: Insufficient documentation

## 2020-03-26 LAB — COMPREHENSIVE METABOLIC PANEL
ALT: 12 U/L (ref 0–44)
AST: 20 U/L (ref 15–41)
Albumin: 4.3 g/dL (ref 3.5–5.0)
Alkaline Phosphatase: 32 U/L — ABNORMAL LOW (ref 38–126)
Anion gap: 10 (ref 5–15)
BUN: 7 mg/dL (ref 6–20)
CO2: 23 mmol/L (ref 22–32)
Calcium: 9.7 mg/dL (ref 8.9–10.3)
Chloride: 104 mmol/L (ref 98–111)
Creatinine, Ser: 0.74 mg/dL (ref 0.44–1.00)
GFR, Estimated: >60 mL/min (ref >60)
Glucose, Bld: 99 mg/dL (ref 70–99)
Potassium: 3.9 mmol/L (ref 3.5–5.1)
Sodium: 137 mmol/L (ref 135–145)
Total Bilirubin: 0.8 mg/dL (ref 0.3–1.2)
Total Protein: 7.1 g/dL (ref 6.5–8.1)

## 2020-03-26 LAB — ABO/RH: ABO/RH(D): O POS

## 2020-03-26 LAB — CBC
HCT: 39.7 % (ref 36.0–46.0)
Hemoglobin: 13.6 g/dL (ref 12.0–15.0)
MCH: 32.2 pg (ref 26.0–34.0)
MCHC: 34.3 g/dL (ref 30.0–36.0)
MCV: 93.9 fL (ref 80.0–100.0)
Platelets: 262 10*3/uL (ref 150–400)
RBC: 4.23 MIL/uL (ref 3.87–5.11)
RDW: 11.5 % (ref 11.5–15.5)
WBC: 7.4 10*3/uL (ref 4.0–10.5)
nRBC: 0 % (ref 0.0–0.2)

## 2020-03-26 LAB — URINALYSIS, ROUTINE W REFLEX MICROSCOPIC
Bilirubin Urine: NEGATIVE
Glucose, UA: NEGATIVE mg/dL
Hgb urine dipstick: NEGATIVE
Ketones, ur: NEGATIVE mg/dL
Leukocytes,Ua: NEGATIVE
Nitrite: NEGATIVE
Protein, ur: NEGATIVE mg/dL
Specific Gravity, Urine: 1.014 (ref 1.005–1.030)
pH: 6 (ref 5.0–8.0)

## 2020-03-26 LAB — POCT PREGNANCY, URINE: Preg Test, Ur: POSITIVE — AB

## 2020-03-26 LAB — HCG, QUANTITATIVE, PREGNANCY: hCG, Beta Chain, Quant, S: 15217 m[IU]/mL — ABNORMAL HIGH (ref <5)

## 2020-03-26 NOTE — MAU Note (Signed)
Had pelvic pain, on the rt side - started on Sat.  Today has become more constant with increases. No bleeding.  Called OB this morning, was told to come in because the sharpness got worse. Has had bloodwork in office.(hcg 800 last Tues)

## 2020-03-26 NOTE — MAU Provider Note (Addendum)
History     CSN: [id number]  Arrival date and time: 03/26/20 1706   Event Date/Time   First Provider Initiated Contact with Patient 03/26/20 1813      Chief Complaint  Patient presents with  . Abdominal Pain   HPI Deanna Maxwell is a 29 y.o. G1P0 in early pregnancy who presents to MAU with chief complaint of "sharp RLQ pain" in the setting of Quant hCG 800. She reports a history of mild cramping and reports her ob/gyn provider advised her to present to MAU for evaluation if her abdominal cramping worsened, which it did on Saturday 03/23/2020. Patient denies bleeding, dysuria, fever or recent illness. She is remote from sexual intercourse.  She receives care with [facility].   OB History    Gravida  1   Para      Term      Preterm      AB      Living        SAB      IAB      Ectopic      Multiple      Live Births              No past medical history on file.  Past Surgical History:  Procedure Laterality Date  . ANTERIOR CRUCIATE LIGAMENT REPAIR    . KNEE SURGERY      Family History  Problem Relation Age of Onset  . Healthy Mother   . Cancer Father   . Hypertension Father   . Heart disease Other     Social History   Tobacco Use  . Smoking status: Never Smoker  Substance Use Topics  . Alcohol use: Yes  . Drug use: Never    Allergies: No Known Allergies  Medications Prior to Admission  Medication Sig Dispense Refill Last Dose  . dicyclomine (BENTYL) 10 MG capsule Take 1 capsule (10 mg total) by mouth 3 (three) times daily before meals. 120 capsule 0   . Multiple Vitamin (MULTIVITAMIN) tablet Take 1 tablet by mouth daily.       Review of Systems  Gastrointestinal: Positive for abdominal pain.  All other systems reviewed and are negative.  Physical Exam   Blood pressure 136/71, pulse 93, temperature 98.9 F (37.2 C), temperature source Oral, resp. rate 17, height 5\' 4"  (1.626 m), weight 73.8 kg, last menstrual period  02/13/2020, SpO2 100 %.  Physical Exam Vitals and nursing note reviewed. Exam conducted with a chaperone present.  Constitutional:      Appearance: She is well-developed.  Cardiovascular:     Rate and Rhythm: Normal rate.     Heart sounds: Normal heart sounds.  Pulmonary:     Effort: Pulmonary effort is normal.  Abdominal:     General: Abdomen is flat.     Palpations: Abdomen is soft.     Tenderness: There is no abdominal tenderness. There is no right CVA tenderness or left CVA tenderness.  Skin:    General: Skin is warm and dry.     Capillary Refill: Capillary refill takes less than 2 seconds.  Neurological:     Mental Status: She is alert and oriented to person, place, and time.     MAU Course  Procedures  Patient Vitals for the past 24 hrs:  BP Temp Temp src Pulse Resp SpO2 Height Weight  03/26/20 1747 136/71 98.9 F (37.2 C) Oral 93 17 100 % 5\' 4"  (1.626 m) 73.8 kg   ' Results  for orders placed or performed during the hospital encounter of 03/26/20 (from the past 24 hour(s))  Urinalysis, Routine w reflex microscopic Urine, Clean Catch     Status: None   Collection Time: 03/26/20  5:50 PM  Result Value Ref Range   Color, Urine YELLOW YELLOW   APPearance CLEAR CLEAR   Specific Gravity, Urine 1.014 1.005 - 1.030   pH 6.0 5.0 - 8.0   Glucose, UA NEGATIVE NEGATIVE mg/dL   Hgb urine dipstick NEGATIVE NEGATIVE   Bilirubin Urine NEGATIVE NEGATIVE   Ketones, ur NEGATIVE NEGATIVE mg/dL   Protein, ur NEGATIVE NEGATIVE mg/dL   Nitrite NEGATIVE NEGATIVE   Leukocytes,Ua NEGATIVE NEGATIVE  Pregnancy, urine POC     Status: Abnormal   Collection Time: 03/26/20  6:21 PM  Result Value Ref Range   Preg Test, Ur POSITIVE (A) NEGATIVE  CBC     Status: None   Collection Time: 03/26/20  6:53 PM  Result Value Ref Range   WBC 7.4 4.0 - 10.5 K/uL   RBC 4.23 3.87 - 5.11 MIL/uL   Hemoglobin 13.6 12.0 - 15.0 g/dL   HCT 23.7 62.8 - 31.5 %   MCV 93.9 80.0 - 100.0 fL   MCH 32.2 26.0 -  34.0 pg   MCHC 34.3 30.0 - 36.0 g/dL   RDW 17.6 16.0 - 73.7 %   Platelets 262 150 - 400 K/uL   nRBC 0.0 0.0 - 0.2 %  Comprehensive metabolic panel     Status: Abnormal   Collection Time: 03/26/20  6:53 PM  Result Value Ref Range   Sodium 137 135 - 145 mmol/L   Potassium 3.9 3.5 - 5.1 mmol/L   Chloride 104 98 - 111 mmol/L   CO2 23 22 - 32 mmol/L   Glucose, Bld 99 70 - 99 mg/dL   BUN 7 6 - 20 mg/dL   Creatinine, Ser 1.06 0.44 - 1.00 mg/dL   Calcium 9.7 8.9 - 26.9 mg/dL   Total Protein 7.1 6.5 - 8.1 g/dL   Albumin 4.3 3.5 - 5.0 g/dL   AST 20 15 - 41 U/L   ALT 12 0 - 44 U/L   Alkaline Phosphatase 32 (L) 38 - 126 U/L   Total Bilirubin 0.8 0.3 - 1.2 mg/dL   GFR, Estimated >48 >54 mL/min   Anion gap 10 5 - 15  hCG, quantitative, pregnancy     Status: Abnormal   Collection Time: 03/26/20  6:53 PM  Result Value Ref Range   hCG, Beta Chain, Quant, S 15,217 (H) <5 mIU/mL  ABO/Rh     Status: None   Collection Time: 03/26/20  6:53 PM  Result Value Ref Range   ABO/RH(D) O POS    No rh immune globuloin      NOT A RH IMMUNE GLOBULIN CANDIDATE, PT RH POSITIVE Performed at [facility], [address] [place], [place] [address]    Report given to V. Aundria Rud, CNM who assumes care of patient at this time.  Clayton Bibles, MSN, CNM Certified Nurse Midwife, [facility] for [employer], [facility] 03/26/20 8:10 PM   [place] report reviewed:  US OB LESS THAN 14 WEEKS WITH OB TRANSVAGINAL  Result Date: 03/26/2020 CLINICAL DATA:  Abdominal cramping, beta HCG 15,217 EXAM: OBSTETRIC <14 WK [place] AND TRANSVAGINAL OB US TECHNIQUE: Both transabdominal and transvaginal ultrasound examinations were performed for complete evaluation of the gestation as well as the maternal uterus, adnexal regions, and pelvic cul-de-sac. Transvaginal technique  was performed to assess early pregnancy. COMPARISON:  None. FINDINGS: Intrauterine gestational sac: Single Yolk sac:  Not  Visualized. Embryo:  Not Visualized. Cardiac Activity: Not Visualized. MSD: 9.7 mm   5 w   5 d Subchorionic hemorrhage:  None visualized. Maternal uterus/adnexae: Probable corpus luteum cyst within the right ovary measuring approximately 1.7 cm in diameter. Left ovary is unremarkable. Trace free fluid in the pelvis is likely physiologic. IMPRESSION: 1. Probable early intrauterine gestational sac, but no yolk sac, fetal pole, or cardiac activity yet visualized. Recommend follow-up quantitative B-HCG levels and follow-up US in 14 days to assess viability. This recommendation follows SRU consensus guidelines: Diagnostic Criteria for Nonviable Pregnancy Early in the First Trimester. Malva Limes Med 2013; 102:7253-66. 2. Trace pelvic free fluid, likely physiologic. 3. Probable corpus luteum cyst right ovary. Electronically Signed   By: Sharlet Salina M.D.   On: 03/26/2020 20:42   Discussed results with patient. Educated and discussed GS seen on [place] without yolk sac or embryo. Discussed with patient plan of care to repeat HCG on Friday and to follow up as scheduled in the office for [place] on 1/14. Patient verbalizes understanding and agrees with plan of care.   Discussed reasons to return to MAU. Return to MAU as needed. Pt stable at time of discharge.   Assessment and Plan    1. Empty gestational sac with ongoing pregnancy   2. Abdominal cramping affecting pregnancy    Discharge home Return to MAU on 12/31 AM for repeat HCG - stat HCG signed and held  Return to MAU as needed for reasons discussed and/or emergencies    Follow-up Information    [facility]. Go on 03/29/2020.   Specialty: Obstetrics and Gynecology Why: Return to MAU on 12/31 in the morning for repeat labs  Contact information: [address] [id number] mc [place] [address] [phone]             Allergies as of 03/26/2020   No Known Allergies     Medication List    TAKE these  medications   dicyclomine 10 MG capsule Commonly known as: BENTYL Take 1 capsule (10 mg total) by mouth 3 (three) times daily before meals.   multivitamin tablet Take 1 tablet by mouth daily.      Sharyon Cable, CNM 03/26/20, 9:09 PM

## 2020-03-29 ENCOUNTER — Inpatient Hospital Stay (HOSPITAL_COMMUNITY)
Admission: AD | Admit: 2020-03-29 | Discharge: 2020-03-29 | Disposition: A | Discharge status: Home / Self Care | Payer: 59 | Attending: Obstetrics and Gynecology | Admitting: Obstetrics and Gynecology

## 2020-03-29 ENCOUNTER — Other Ambulatory Visit: Payer: Self-pay

## 2020-03-29 DIAGNOSIS — Z3A01 Less than 8 weeks gestation of pregnancy: Secondary | ICD-10-CM | POA: Insufficient documentation

## 2020-03-29 DIAGNOSIS — O3680X0 Pregnancy with inconclusive fetal viability, not applicable or unspecified: Secondary | ICD-10-CM | POA: Diagnosis not present

## 2020-03-29 DIAGNOSIS — O020 Blighted ovum and nonhydatidiform mole: Secondary | ICD-10-CM

## 2020-03-29 LAB — HCG, QUANTITATIVE, PREGNANCY: hCG, Beta Chain, Quant, S: 34352 m[IU]/mL — ABNORMAL HIGH (ref <5)

## 2020-03-29 NOTE — MAU Provider Note (Signed)
History   Chief Complaint:  Follow-up   Deanna Maxwell is  29 y.o. G1P0 Patient's last menstrual period was 02/13/2020.Marland Kitchen Patient is here for follow up of quantitative HCG and ongoing surveillance of pregnancy status. She is [redacted]w[redacted]d weeks gestation by early ultrasound.    Since her last visit, the patient is without new complaint. The patient reports bleeding as none (has not had any bleeding) and her pain is significantly decreased albeit still mildly present.    General ROS:  negative  Her previous Quantitative HCG values are: 15,217  Physical Exam   Blood pressure 138/67, pulse 67, temperature 98.6 F (37 C), resp. rate 17, last menstrual period 02/13/2020.  Focused Gynecological Exam: exam declined by the patient  Labs: No results found for this or any previous visit (from the past 24 hour(s)).  Ultrasound Studies:   U/S not indicated at this time, pt has follow up U/S scheduled for 04/13/19 at [facility]  Assessment:   1. Empty gestational sac with ongoing pregnancy     Plan: -Discharge home in stable condition - Bleeding precautions discussed -Patient advised to follow-up with [facility] as scheduled -Patient may return to MAU as needed or if her condition were to change or worsen  Bernerd Limbo, CNM 03/31/2020, 4:22 AM

## 2020-03-29 NOTE — MAU Note (Signed)
Here for repeat hcg level check.  Denies having any other issues needing evaluation.  Denies VB.

## 2020-03-30 NOTE — L&D Delivery Note (Signed)
Patient was C/C/+1 and pushed for 1 hour 30 minutes withOUT epidural and with laboring down. NSVD  female infant, Apgars 8,9, weight P.   The patient had a second degree midline perineal laceration repaired with 2-0 vicryl R. Fundus was firm. EBL was expected amount. Placenta was delivered intact. Vagina was clear.  Delayed cord clamping done for 30 seconds while warming baby but a small tear close to the baby of the cord precluded further cord blood. Baby was vigorous and doing skin to skin with mother.  Deanna Maxwell

## 2020-05-01 LAB — OB RESULTS CONSOLE GC/CHLAMYDIA
Chlamydia: NEGATIVE
Gonorrhea: NEGATIVE

## 2020-05-01 LAB — OB RESULTS CONSOLE ABO/RH: RH Type: POSITIVE

## 2020-05-01 LAB — OB RESULTS CONSOLE ANTIBODY SCREEN: Antibody Screen: NEGATIVE

## 2020-05-01 LAB — OB RESULTS CONSOLE RPR: RPR: NONREACTIVE

## 2020-05-01 LAB — OB RESULTS CONSOLE HIV ANTIBODY (ROUTINE TESTING): HIV: NONREACTIVE

## 2020-05-01 LAB — OB RESULTS CONSOLE HEPATITIS B SURFACE ANTIGEN: Hepatitis B Surface Ag: NEGATIVE

## 2020-05-01 LAB — OB RESULTS CONSOLE RUBELLA ANTIBODY, IGM: Rubella: IMMUNE

## 2020-05-10 ENCOUNTER — Other Ambulatory Visit: Payer: Self-pay | Admitting: Obstetrics and Gynecology

## 2020-05-10 DIAGNOSIS — Z363 Encounter for antenatal screening for malformations: Secondary | ICD-10-CM

## 2020-06-18 ENCOUNTER — Other Ambulatory Visit: Payer: Self-pay

## 2020-06-18 ENCOUNTER — Other Ambulatory Visit: Payer: Self-pay | Admitting: *Deleted

## 2020-06-18 ENCOUNTER — Ambulatory Visit: Payer: 59 | Attending: Obstetrics and Gynecology

## 2020-06-18 DIAGNOSIS — Z362 Encounter for other antenatal screening follow-up: Secondary | ICD-10-CM

## 2020-06-18 DIAGNOSIS — Z363 Encounter for antenatal screening for malformations: Secondary | ICD-10-CM | POA: Diagnosis not present

## 2020-07-17 ENCOUNTER — Other Ambulatory Visit: Payer: Self-pay | Admitting: *Deleted

## 2020-07-17 ENCOUNTER — Other Ambulatory Visit: Payer: Self-pay

## 2020-07-17 ENCOUNTER — Ambulatory Visit: Payer: 59 | Attending: Obstetrics and Gynecology

## 2020-07-17 DIAGNOSIS — R6252 Short stature (child): Secondary | ICD-10-CM

## 2020-07-17 DIAGNOSIS — Z362 Encounter for other antenatal screening follow-up: Secondary | ICD-10-CM | POA: Diagnosis not present

## 2020-07-17 DIAGNOSIS — O321XX0 Maternal care for breech presentation, not applicable or unspecified: Secondary | ICD-10-CM | POA: Diagnosis not present

## 2020-07-17 DIAGNOSIS — Z3A22 22 weeks gestation of pregnancy: Secondary | ICD-10-CM

## 2020-08-14 ENCOUNTER — Ambulatory Visit: Payer: 59 | Attending: Obstetrics

## 2020-08-14 ENCOUNTER — Encounter: Payer: Self-pay | Admitting: *Deleted

## 2020-08-14 ENCOUNTER — Ambulatory Visit: Payer: 59 | Admitting: *Deleted

## 2020-08-14 ENCOUNTER — Other Ambulatory Visit: Payer: Self-pay

## 2020-08-14 VITALS — BP 122/67 | HR 69

## 2020-08-14 DIAGNOSIS — Z362 Encounter for other antenatal screening follow-up: Secondary | ICD-10-CM | POA: Diagnosis not present

## 2020-08-14 DIAGNOSIS — Z3689 Encounter for other specified antenatal screening: Secondary | ICD-10-CM | POA: Diagnosis present

## 2020-08-14 DIAGNOSIS — Z3A26 26 weeks gestation of pregnancy: Secondary | ICD-10-CM | POA: Diagnosis not present

## 2020-08-14 DIAGNOSIS — R6252 Short stature (child): Secondary | ICD-10-CM

## 2020-10-25 LAB — OB RESULTS CONSOLE GBS: GBS: NEGATIVE

## 2020-11-18 ENCOUNTER — Telehealth (HOSPITAL_COMMUNITY): Payer: Self-pay | Admitting: *Deleted

## 2020-11-18 NOTE — Telephone Encounter (Signed)
Preadmission screen  

## 2020-11-26 ENCOUNTER — Encounter (HOSPITAL_COMMUNITY): Payer: Self-pay | Admitting: Obstetrics and Gynecology

## 2020-11-26 ENCOUNTER — Inpatient Hospital Stay (HOSPITAL_COMMUNITY): Payer: 59

## 2020-11-26 ENCOUNTER — Inpatient Hospital Stay (HOSPITAL_COMMUNITY)
Admission: AD | Admit: 2020-11-26 | Discharge: 2020-11-28 | DRG: 807 | Disposition: A | Discharge status: Home / Self Care | Payer: 59 | Attending: Obstetrics and Gynecology | Admitting: Obstetrics and Gynecology

## 2020-11-26 ENCOUNTER — Other Ambulatory Visit: Payer: Self-pay

## 2020-11-26 DIAGNOSIS — Z3A41 41 weeks gestation of pregnancy: Secondary | ICD-10-CM

## 2020-11-26 DIAGNOSIS — Z20822 Contact with and (suspected) exposure to covid-19: Secondary | ICD-10-CM | POA: Diagnosis present

## 2020-11-26 DIAGNOSIS — O480 Post-term pregnancy: Secondary | ICD-10-CM | POA: Diagnosis present

## 2020-11-26 DIAGNOSIS — Z3493 Encounter for supervision of normal pregnancy, unspecified, third trimester: Secondary | ICD-10-CM

## 2020-11-26 DIAGNOSIS — Z3490 Encounter for supervision of normal pregnancy, unspecified, unspecified trimester: Secondary | ICD-10-CM

## 2020-11-26 HISTORY — DX: Other specified health status: Z78.9

## 2020-11-26 LAB — CBC
HCT: 34.9 % — ABNORMAL LOW (ref 36.0–46.0)
Hemoglobin: 12 g/dL (ref 12.0–15.0)
MCH: 33.3 pg (ref 26.0–34.0)
MCHC: 34.4 g/dL (ref 30.0–36.0)
MCV: 96.9 fL (ref 80.0–100.0)
Platelets: 255 10*3/uL (ref 150–400)
RBC: 3.6 MIL/uL — ABNORMAL LOW (ref 3.87–5.11)
RDW: 12.9 % (ref 11.5–15.5)
WBC: 11 10*3/uL — ABNORMAL HIGH (ref 4.0–10.5)
nRBC: 0 % (ref 0.0–0.2)

## 2020-11-26 LAB — TYPE AND SCREEN
ABO/RH(D): O POS
Antibody Screen: NEGATIVE

## 2020-11-26 LAB — RESP PANEL BY RT-PCR (FLU A&B, COVID) ARPGX2
Influenza A by PCR: NEGATIVE
Influenza B by PCR: NEGATIVE
SARS Coronavirus 2 by RT PCR: NEGATIVE

## 2020-11-26 MED ORDER — OXYTOCIN BOLUS FROM INFUSION
333.0000 mL | Freq: Once | INTRAVENOUS | Status: AC
  Administered 2020-11-27: 333 mL via INTRAVENOUS

## 2020-11-26 MED ORDER — LACTATED RINGERS IV SOLN: 500.0000 mL | INTRAVENOUS | Status: DC | PRN

## 2020-11-26 MED ORDER — LACTATED RINGERS IV SOLN: INTRAVENOUS | Status: DC

## 2020-11-26 MED ORDER — ONDANSETRON HCL 4 MG/2ML IJ SOLN
4.0000 mg | Freq: Four times a day (QID) | INTRAMUSCULAR | Status: DC | PRN
  Administered 2020-11-27: 4 mg via INTRAVENOUS
  Filled 2020-11-26: qty 2

## 2020-11-26 MED ORDER — OXYCODONE-ACETAMINOPHEN 5-325 MG PO TABS: 2.0000 | ORAL_TABLET | ORAL | Status: DC | PRN

## 2020-11-26 MED ORDER — ACETAMINOPHEN 325 MG PO TABS: 650.0000 mg | ORAL_TABLET | ORAL | Status: DC | PRN

## 2020-11-26 MED ORDER — LIDOCAINE HCL (PF) 1 % IJ SOLN
30.0000 mL | INTRAMUSCULAR | Status: DC | PRN
  Filled 2020-11-26: qty 30

## 2020-11-26 MED ORDER — TERBUTALINE SULFATE 1 MG/ML IJ SOLN: 0.2500 mg | Freq: Once | INTRAMUSCULAR | Status: DC | PRN

## 2020-11-26 MED ORDER — OXYCODONE-ACETAMINOPHEN 5-325 MG PO TABS: 1.0000 | ORAL_TABLET | ORAL | Status: DC | PRN

## 2020-11-26 MED ORDER — MISOPROSTOL 25 MCG QUARTER TABLET
25.0000 ug | ORAL_TABLET | ORAL | Status: DC | PRN
  Administered 2020-11-26 (×3): 25 ug via VAGINAL
  Filled 2020-11-26 (×4): qty 1

## 2020-11-26 MED ORDER — OXYTOCIN-SODIUM CHLORIDE 30-0.9 UT/500ML-% IV SOLN
1.0000 m[IU]/min | INTRAVENOUS | Status: DC
  Administered 2020-11-26: 2 m[IU]/min via INTRAVENOUS
  Filled 2020-11-26: qty 500

## 2020-11-26 MED ORDER — SOD CITRATE-CITRIC ACID 500-334 MG/5ML PO SOLN
30.0000 mL | ORAL | Status: DC | PRN
  Administered 2020-11-27: 30 mL via ORAL
  Filled 2020-11-26: qty 30

## 2020-11-26 MED ORDER — MISOPROSTOL 50MCG HALF TABLET: 50.0000 ug | ORAL_TABLET | ORAL | Status: DC | PRN

## 2020-11-26 MED ORDER — OXYTOCIN-SODIUM CHLORIDE 30-0.9 UT/500ML-% IV SOLN: 2.5000 [IU]/h | INTRAVENOUS | Status: DC

## 2020-11-26 NOTE — Progress Notes (Signed)
Pt having cramping per nurse.  Vitals:   11/26/20 0818 11/26/20 1222 11/26/20 1632 11/26/20 1926  BP: 120/75 134/80 127/77 127/77  Pulse: 72 70 72 74  Resp: 16 16 16    Temp: 98.2 F (36.8 C) 98.2 F (36.8 C) 98.2 F (36.8 C)   TempSrc: Oral Oral Oral   Weight:      Height:        FHTs 120, G STV, NST R, cat 1 Toco irregular SVE per nurse still 0.5/70/-1  A/P Continue cytotec.  Consider foley if more dilated later.

## 2020-11-26 NOTE — H&P (Signed)
30 y.o. [redacted]w[redacted]d  G1P0 comes in for induction for post dates.  Otherwise has good fetal movement and no bleeding.  Past Medical History:  Diagnosis Date   Medical history non-contributory     Past Surgical History:  Procedure Laterality Date   ANTERIOR CRUCIATE LIGAMENT REPAIR     KNEE SURGERY      OB History  Gravida Para Term Preterm AB Living  1            SAB IAB Ectopic Multiple Live Births               # Outcome Date GA Lbr Len/2nd Weight Sex Delivery Anes PTL Lv  1 Current             Social History   Socioeconomic History   Marital status: Married    Spouse name: Not on file   Number of children: Not on file   Years of education: Not on file   Highest education level: Not on file  Occupational History   Not on file  Tobacco Use   Smoking status: Never   Smokeless tobacco: Never  Vaping Use   Vaping Use: Never used  Substance and Sexual Activity   Alcohol use: Never   Drug use: Never   Sexual activity: Not on file  Other Topics Concern   Not on file  Social History Narrative   Not on file   Social Determinants of Health   Financial Resource Strain: Not on file  Food Insecurity: Not on file  Transportation Needs: Not on file  Physical Activity: Not on file  Stress: Not on file  Social Connections: Not on file  Intimate Partner Violence: Not on file   Patient has no known allergies.    Prenatal Transfer Tool  Maternal Diabetes: No Genetic Screening: Normal Maternal Ultrasounds/Referrals: Normal Fetal Ultrasounds or other Referrals:  None Maternal Substance Abuse:  No Significant Maternal Medications:  None Significant Maternal Lab Results: Group B Strep negative  Other PNC: uncomplicated.    Vitals:   11/26/20 0652  BP: 123/80  Pulse: 83  Resp: 18  Temp: 98.3 F (36.8 C)  TempSrc: Oral  Weight: 91.4 kg  Height: 5\' 3"  (1.6 m)    Lungs/Cor:  NAD Abdomen:  soft, gravid Ex:  no cords, erythema SVE:  in office closes FHTs:  120s, good  STV, NST R; Cat 1 tracing. Toco:  q occ   A/P   Post term induction.  Cytotec.  GBS neg.  Bedside pending for presentation.  [place]

## 2020-11-27 ENCOUNTER — Encounter (HOSPITAL_COMMUNITY): Payer: Self-pay | Admitting: Obstetrics and Gynecology

## 2020-11-27 LAB — RPR: RPR Ser Ql: NONREACTIVE

## 2020-11-27 MED ORDER — BENZOCAINE-MENTHOL 20-0.5 % EX AERO
1.0000 "application " | INHALATION_SPRAY | CUTANEOUS | Status: DC | PRN
  Administered 2020-11-27: 1 via TOPICAL
  Filled 2020-11-27: qty 56

## 2020-11-27 MED ORDER — ONDANSETRON HCL 4 MG PO TABS: 4.0000 mg | ORAL_TABLET | ORAL | Status: DC | PRN

## 2020-11-27 MED ORDER — SODIUM CHLORIDE 0.9% FLUSH
3.0000 mL | Freq: Two times a day (BID) | INTRAVENOUS | Status: DC
  Administered 2020-11-27: 3 mL via INTRAVENOUS

## 2020-11-27 MED ORDER — MEASLES, MUMPS & RUBELLA VAC IJ SOLR: 0.5000 mL | Freq: Once | INTRAMUSCULAR | Status: DC

## 2020-11-27 MED ORDER — SIMETHICONE 80 MG PO CHEW: 80.0000 mg | CHEWABLE_TABLET | ORAL | Status: DC | PRN

## 2020-11-27 MED ORDER — WITCH HAZEL-GLYCERIN EX PADS: 1.0000 "application " | MEDICATED_PAD | CUTANEOUS | Status: DC | PRN

## 2020-11-27 MED ORDER — MAGNESIUM HYDROXIDE 400 MG/5ML PO SUSP: 30.0000 mL | ORAL | Status: DC | PRN

## 2020-11-27 MED ORDER — COCONUT OIL OIL: 1.0000 "application " | TOPICAL_OIL | Status: DC | PRN

## 2020-11-27 MED ORDER — PRENATAL MULTIVITAMIN CH
1.0000 | ORAL_TABLET | Freq: Every day | ORAL | Status: DC
  Administered 2020-11-27: 1 via ORAL
  Filled 2020-11-27 (×2): qty 1

## 2020-11-27 MED ORDER — METHYLERGONOVINE MALEATE 0.2 MG PO TABS
0.2000 mg | ORAL_TABLET | ORAL | Status: DC | PRN
Start: 2020-11-27 — End: 2020-11-28

## 2020-11-27 MED ORDER — OXYCODONE-ACETAMINOPHEN 5-325 MG PO TABS: 2.0000 | ORAL_TABLET | ORAL | Status: DC | PRN

## 2020-11-27 MED ORDER — BUTORPHANOL TARTRATE 1 MG/ML IJ SOLN
1.0000 mg | INTRAMUSCULAR | Status: DC | PRN
  Administered 2020-11-27: 1 mg via INTRAVENOUS
  Filled 2020-11-27: qty 1

## 2020-11-27 MED ORDER — ZOLPIDEM TARTRATE 5 MG PO TABS: 5.0000 mg | ORAL_TABLET | Freq: Every evening | ORAL | Status: DC | PRN

## 2020-11-27 MED ORDER — ONDANSETRON HCL 4 MG/2ML IJ SOLN: 4.0000 mg | INTRAMUSCULAR | Status: DC | PRN

## 2020-11-27 MED ORDER — ACETAMINOPHEN 325 MG PO TABS: 650.0000 mg | ORAL_TABLET | ORAL | Status: DC | PRN

## 2020-11-27 MED ORDER — SENNOSIDES-DOCUSATE SODIUM 8.6-50 MG PO TABS
2.0000 | ORAL_TABLET | Freq: Every day | ORAL | Status: DC
  Filled 2020-11-27: qty 2

## 2020-11-27 MED ORDER — SODIUM CHLORIDE 0.9 % IV SOLN: 250.0000 mL | INTRAVENOUS | Status: DC | PRN

## 2020-11-27 MED ORDER — DIPHENHYDRAMINE HCL 25 MG PO CAPS: 25.0000 mg | ORAL_CAPSULE | Freq: Four times a day (QID) | ORAL | Status: DC | PRN

## 2020-11-27 MED ORDER — DICYCLOMINE HCL 10 MG PO CAPS
10.0000 mg | ORAL_CAPSULE | Freq: Three times a day (TID) | ORAL | Status: DC
  Filled 2020-11-27 (×5): qty 1

## 2020-11-27 MED ORDER — DIBUCAINE (PERIANAL) 1 % EX OINT: 1.0000 "application " | TOPICAL_OINTMENT | CUTANEOUS | Status: DC | PRN

## 2020-11-27 MED ORDER — SODIUM CHLORIDE 0.9% FLUSH: 3.0000 mL | INTRAVENOUS | Status: DC | PRN

## 2020-11-27 MED ORDER — TETANUS-DIPHTH-ACELL PERTUSSIS 5-2.5-18.5 LF-MCG/0.5 IM SUSY: 0.5000 mL | PREFILLED_SYRINGE | Freq: Once | INTRAMUSCULAR | Status: DC

## 2020-11-27 MED ORDER — METHYLERGONOVINE MALEATE 0.2 MG/ML IJ SOLN: 0.2000 mg | INTRAMUSCULAR | Status: DC | PRN

## 2020-11-27 MED ORDER — FERROUS SULFATE 325 (65 FE) MG PO TABS
325.0000 mg | ORAL_TABLET | Freq: Two times a day (BID) | ORAL | Status: DC
  Administered 2020-11-27: 325 mg via ORAL
  Filled 2020-11-27 (×2): qty 1

## 2020-11-27 MED ORDER — IBUPROFEN 800 MG PO TABS
800.0000 mg | ORAL_TABLET | Freq: Three times a day (TID) | ORAL | Status: DC
  Administered 2020-11-27 – 2020-11-28 (×4): 800 mg via ORAL
  Filled 2020-11-27 (×5): qty 1

## 2020-11-27 NOTE — Progress Notes (Signed)
Pt having painful contractions and needing to push.  Vitals:   11/27/20 0330 11/27/20 0400 11/27/20 0430 11/27/20 0530  BP: (!) 155/94 (!) 141/76 124/75 137/66  Pulse: 88 69 69 73  Resp: 14 13 15 14   Temp:      TempSrc:      Weight:      Height:        FHTs 120s, gSTV, NST reactive, repetitive mild variables especially with pushing. Toco q3-4  SVE C/C/+2   A: Term induction now complete since.0350 but only pushed for less than an hour total as of yet.  FHTs much better with laboring or involuntary pushing.  Fetal head almost within reach for VE assist,  D/w pt that the fetal heart rate is not reassuring enough to continue pushing for a prolonged time.  I d/w her that epidural would allow her to be more comfortable and allow me to do a vacuum assisted delivery if needed.  If baby's heart rate remains the same and delivery not imminent, we may be faced with a cesarean section to deliver the baby.    Pt discussing options with husband.

## 2020-11-27 NOTE — Lactation Note (Addendum)
This note was copied from a baby's chart. Lactation Consultation Note  Patient Name: Deanna Maxwell [id number] Date: 11/27/2020 Reason for consult: Initial assessment;Nipple pain/trauma;Primapara;Term Age:30 hours   Infant cueing in bassinet.  Mom attempted an hour ago but infant didn't stay latched.  Mom describes pain with nursing and has soreness on her left nipple.  +breast changes with pregnancy.  Infant clamps down on gloved finger during oral assessment and does not establish a rhythmic sucking motion.  Placed in laid back position, infant grasps breast with narrow gape.  LC uses gentle pressure to chin to assist in widening gape and flanging top lip which mom states relieves pinching.  Mom then states a few minutes into the feeding she feels infant is "tightening" again.  Soft rhythmic clicking noted when infant feeds. Continuous rhythmic sucking noted.  Mom feels pinching, pain of 4.  Slight crease noted on right nipple when infant is finished feeding.     BF on left side and no clicking noted during feeding but swallows were audible.  Infant burped then fell asleep on breast.  BF basics reviewed.  STS, hand express and collect to feed back to infant.  Call out for assistance or if pain continues with next feeding.  Brochure provided and mom is aware of resources, OP LC, support groups, and phone line.  [facility] patient, recommended Jimmye Norman for OP if needed.  Maternal Data Has patient been taught Hand Expression?: Yes  Feeding Mother's Current Feeding Choice: Breast Milk  LATCH Score Latch: Grasps breast easily, tongue down, lips flanged, rhythmical sucking.  Audible Swallowing: Spontaneous and intermittent  Type of Nipple: Everted at rest and after stimulation  Comfort (Breast/Nipple): Filling, red/small blisters or bruises, mild/mod discomfort  Hold (Positioning): Assistance needed to correctly position infant at breast and maintain latch.  LATCH Score:  8   Lactation Tools Discussed/Used    Interventions Interventions: Breast feeding basics reviewed;Assisted with latch;Skin to skin;Breast massage;Hand express;Position options;Support pillows;Adjust position;Breast compression;Education  Discharge    Consult Status Consult Status: Follow-up Date: 11/28/20 Follow-up type: In-patient    Maryruth Hancock [facility] 11/27/2020, 1:44 PM

## 2020-11-27 NOTE — Lactation Note (Signed)
This note was copied from a baby's chart. Lactation Consultation Note  Patient Name: Girl Charley Lafrance [id number] Date: 11/27/2020 Reason for consult: L&D Initial assessment;Term;Primapara;1st time breastfeeding Age:30 hours  Mom, dad, doula in room.  Mom states infant latched for awhile.  Cueing so LC assisted with latch.  Rhythmic sucking noted but bottom lips was tucked and mom felt a pinching.  Mom and dad were educated on breaking the latch.  Latched again in laid back.  Chin gently tugged to flange bottom lip and mom felt some relief.  Parents had questions regarding when to feed and how often.  All answered.  Encouraged hand expression prior to feeding and after with plenty of STS for opportunities for baby to feed.  Maternal Data Has patient been taught Hand Expression?: Yes Does the patient have breastfeeding experience prior to this delivery?: No  Feeding Mother's Current Feeding Choice: Breast Milk  LATCH Score Latch: Repeated attempts needed to sustain latch, nipple held in mouth throughout feeding, stimulation needed to elicit sucking reflex.  Audible Swallowing: None  Type of Nipple: Everted at rest and after stimulation  Comfort (Breast/Nipple): Filling, red/small blisters or bruises, mild/mod discomfort  Hold (Positioning): Assistance needed to correctly position infant at breast and maintain latch.  LATCH Score: 5   Lactation Tools Discussed/Used    Interventions Interventions: Breast feeding basics reviewed;Assisted with latch;Skin to skin;Breast massage;Hand express;Support pillows;Adjust position  Discharge    Consult Status Consult Status: Follow-up from L&D    Maryruth Hancock [facility] 11/27/2020, 8:25 AM

## 2020-11-28 LAB — CBC
HCT: 31.3 % — ABNORMAL LOW (ref 36.0–46.0)
Hemoglobin: 10.7 g/dL — ABNORMAL LOW (ref 12.0–15.0)
MCH: 33.3 pg (ref 26.0–34.0)
MCHC: 34.2 g/dL (ref 30.0–36.0)
MCV: 97.5 fL (ref 80.0–100.0)
Platelets: 212 10*3/uL (ref 150–400)
RBC: 3.21 MIL/uL — ABNORMAL LOW (ref 3.87–5.11)
RDW: 13.2 % (ref 11.5–15.5)
WBC: 13.2 10*3/uL — ABNORMAL HIGH (ref 4.0–10.5)
nRBC: 0 % (ref 0.0–0.2)

## 2020-11-28 MED ORDER — IBUPROFEN 600 MG PO TABS: 600.0000 mg | ORAL_TABLET | Freq: Four times a day (QID) | ORAL | 0 refills | Status: AC | PRN

## 2020-11-28 NOTE — Discharge Summary (Signed)
Postpartum Discharge Summary    Patient Name: Deanna Maxwell DOB: 02/14/1991 MRN: [id number]  Date of admission: 11/26/2020 Delivery date:11/27/2020  Delivering provider: Carrington Clamp  Date of discharge: 11/28/2020  Admitting diagnosis: Pregnant and not yet delivered [Z34.90] Intrauterine pregnancy: [redacted]w[redacted]d     Secondary diagnosis:  Active Problems:   Pregnant and not yet delivered     Discharge diagnosis: Term Pregnancy Delivered                                              Post partum procedures: NA Augmentation: AROM, Pitocin, and Cytotec Complications: None  Hospital course: Induction of Labor With Vaginal Delivery   30 y.o. yo G1P1001 at [redacted]w[redacted]d was admitted to the hospital 11/26/2020 for induction of labor.  Indication for induction: Postdates.  Patient had an uncomplicated labor course as follows: Membrane Rupture Time/Date: 11:40 PM ,11/26/2020   Delivery Method:Vaginal, Spontaneous  Episiotomy: None  Lacerations:  2nd degree  Details of delivery can be found in separate delivery note.  Patient had a routine postpartum course. Patient is discharged home 11/28/20.  Newborn Data: Birth date:11/27/2020  Birth time:7:11 AM  Gender:Female  Living status:Living  Apgars:8 ,9  Weight:3375 g     Physical exam  Vitals:   11/27/20 1530 11/27/20 2025 11/28/20 0000 11/28/20 0543  BP: 122/67 117/61 116/69 121/77  Pulse: 64 75 63 62  Resp: 16 18 18 18   Temp: 99 F (37.2 C) 98.3 F (36.8 C) 98.2 F (36.8 C) 98.1 F (36.7 C)  TempSrc: Oral Oral Oral Oral  SpO2: 100% 98%  99%  Weight:      Height:       General: alert, cooperative, and no distress Lochia: appropriate Uterine Fundus: firm DVT Evaluation: No evidence of DVT seen on physical exam. Labs: Lab Results  Component Value Date   WBC 13.2 (H) 11/28/2020   HGB 10.7 (L) 11/28/2020   HCT 31.3 (L) 11/28/2020   MCV 97.5 11/28/2020   PLT 212 11/28/2020   CMP Latest Ref Rng & Units 03/26/2020  Glucose 70 -  99 mg/dL 99  BUN 6 - 20 mg/dL 7  Creatinine 03/28/2020 - 1.51 mg/dL 7.61  Sodium 6.07 - 371 mmol/L 137  Potassium 3.5 - 5.1 mmol/L 3.9  Chloride 98 - 111 mmol/L 104  CO2 22 - 32 mmol/L 23  Calcium 8.9 - 10.3 mg/dL 9.7  Total Protein 6.5 - 8.1 g/dL 7.1  Total Bilirubin 0.3 - 1.2 mg/dL 0.8  Alkaline Phos 38 - 126 U/L 32(L)  AST 15 - 41 U/L 20  ALT 0 - 44 U/L 12   Edinburgh Score: Edinburgh Postnatal Depression Scale Screening Tool 11/27/2020  I have been able to laugh and see the funny side of things. 0  I have looked forward with enjoyment to things. 0  I have blamed myself unnecessarily when things went wrong. 2  I have been anxious or worried for no good reason. 2  I have felt scared or panicky for no good reason. 1  Things have been getting on top of me. 1  I have been so unhappy that I have had difficulty sleeping. 0  I have felt sad or miserable. 1  I have been so unhappy that I have been crying. 1  The thought of harming myself has occurred to me. 0  Edinburgh Postnatal Depression Scale  Total 8      After visit meds:  Allergies as of 11/28/2020   No Known Allergies      Medication List     STOP taking these medications    dicyclomine 10 MG capsule Commonly known as: BENTYL   multivitamin tablet       TAKE these medications    ibuprofen 600 MG tablet Commonly known as: ADVIL Take 1 tablet (600 mg total) by mouth every 6 (six) hours as needed.               Discharge Care Instructions  (From admission, onward)           Start     Ordered   11/28/20 0000  Discharge wound care:       Comments: For a cesarean delivery: You may wash incision with soap and water.  Do not soak or submerge the incision for 2 weeks. Keep incision dry. You may need to keep a sanitary pad or panty liner between the incision and your clothing for comfort and to keep the incision dry. If you note drainage, increased pain, or increased redness of the incision, then please  notify your physician.   11/28/20 1050   11/28/20 0000  If the dressing is still on your incision site when you go home, remove it on the third day after your surgery date. Remove dressing if it begins to fall off, or if it is dirty or damaged before the third day.       Comments: For a cesarean delivery   11/28/20 1050             Discharge home in stable condition Infant Feeding: Breast Infant Disposition:home with mother Discharge instruction: per After Visit Summary and Postpartum booklet. Activity: Advance as tolerated. Pelvic rest for 6 weeks.  Diet: routine diet Anticipated Birth Control: Unsure Postpartum Appointment:4 weeks Future Appointments:No future appointments. Follow up Visit:  Follow-up Information     Carrington Clamp, MD Follow up in 4 week(s).   Specialty: Obstetrics and Gynecology Why: For a postpartum evaluation Contact information: [address] [place] [address] [phone]                     11/28/2020 Waynard Reeds, MD

## 2020-11-28 NOTE — Lactation Note (Signed)
This note was copied from a baby's chart. Lactation Consultation Note  Patient Name: Deanna Maxwell [id number] Date: 11/28/2020 Reason for consult: Mother's request;Primapara;Term Age:30 hours  Mom was assisted with latching in two different positions. Once Mom was in a laid-back position, infant latched with ease with good swallows (verified by cervical auscultation) & Mom was comfortable.  Parents' questions were answered to their satisfaction. Parents know how to reach [place] for post-discharge questions.   Lurline Hare [facility] 11/28/2020, 10:35 AM

## 2020-11-28 NOTE — Lactation Note (Signed)
This note was copied from a baby's chart. Lactation Consultation Note  Patient Name: Deanna Maxwell [id number] Date: 11/28/2020 Reason for consult: Mother's request;Term;Primapara Age:30 hours  Mom says "Dala Dock" will latch to R breast more readily than L breast. Mom's L nipple is intact; R nipple is slightly abraded at tip. Mom reports that nipple shape is not rounded when infant releases latch about 50% of the time. Mom thinks perhaps infant's top lip is touching first at the moment of latch, instead of the bottom lip. Specifics of an asymmetric latch were shown via [employer].   Latch was attempted with Mom laying on her side, but infant was not interested at this time.   Parents will call out for latch assist when infant shows feeding cues.   Lurline Hare [facility] 11/28/2020, 9:41 AM

## 2020-12-11 ENCOUNTER — Telehealth (HOSPITAL_COMMUNITY): Payer: Self-pay

## 2020-12-11 NOTE — Telephone Encounter (Signed)
  No answer. Left message to return nurse call.  Marcelino Duster [facility] 12/11/2020,1856

## 2021-01-30 IMAGING — US US OB < 14 WEEKS - US OB TV
1 series · 15 of 28 positions shown · non-contrast
Comparison: None.

CLINICAL DATA: Abdominal cramping, beta HCG 15,217

EXAM:
OBSTETRIC <14 WK US AND TRANSVAGINAL OB US
TECHNIQUE: Both transabdominal and transvaginal ultrasound examinations were
performed for complete evaluation of the gestation as well as the
maternal uterus, adnexal regions, and pelvic cul-de-sac.
Transvaginal technique was performed to assess early pregnancy.

[Series 1: us ob < 14 weeks - us ob tv · 46 acquisitions, 15 frames shown]
[im 1/46]
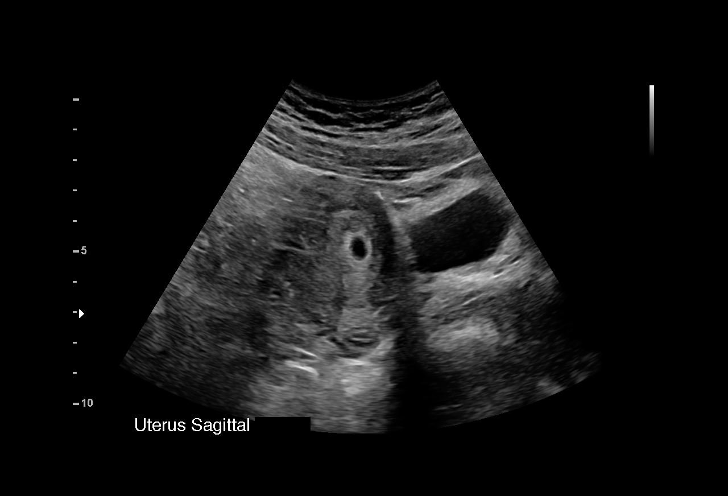
[im 4/46]
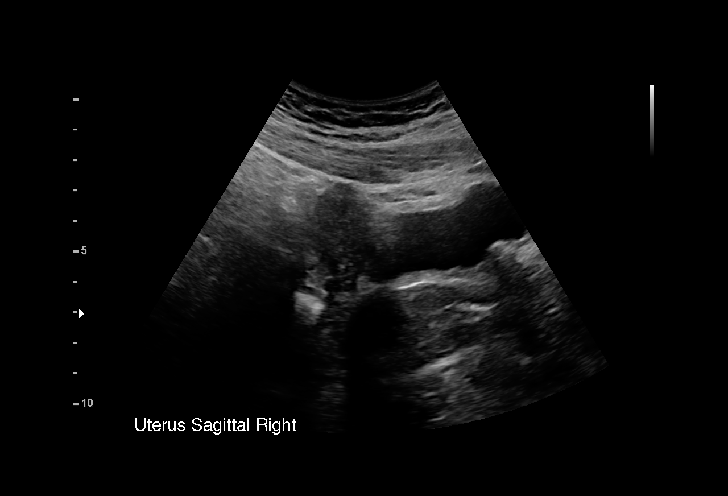
[im 7/46]
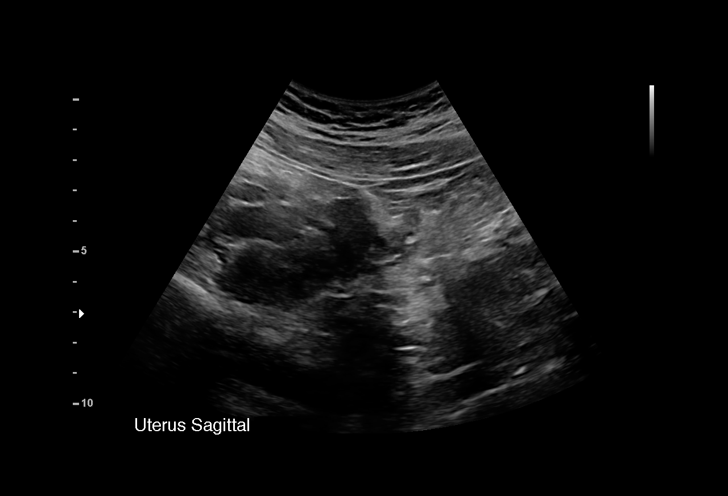
[im 11/46]
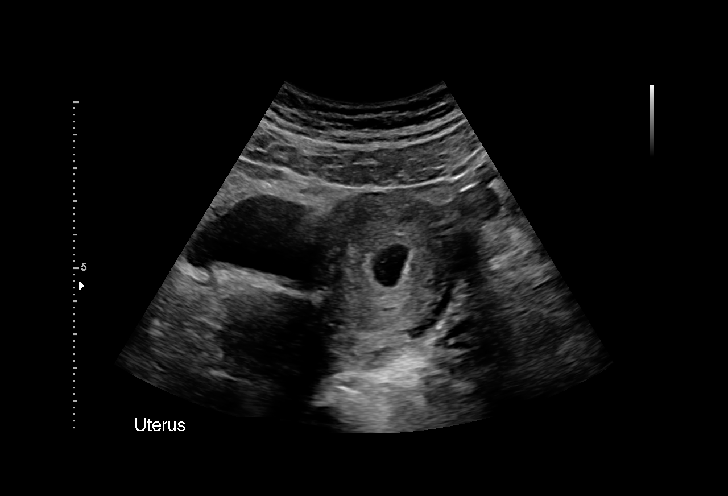
[im 14/46]
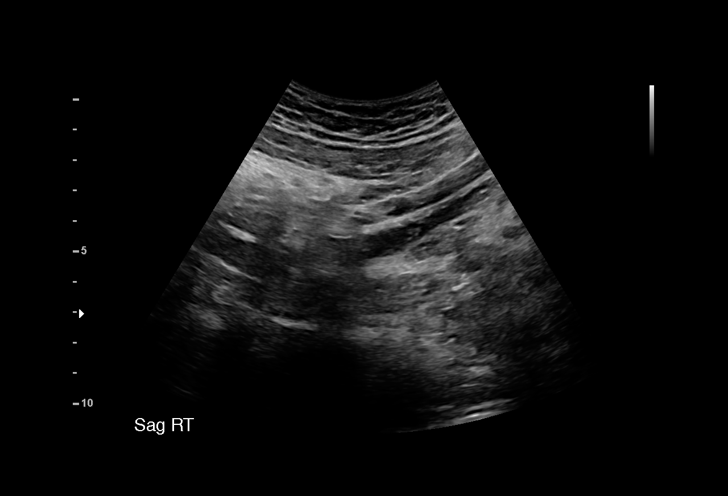
[im 17/46]
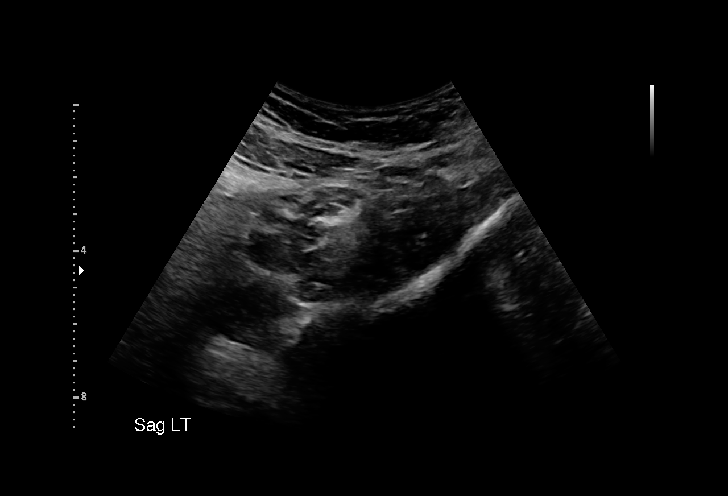
[im 21/46]
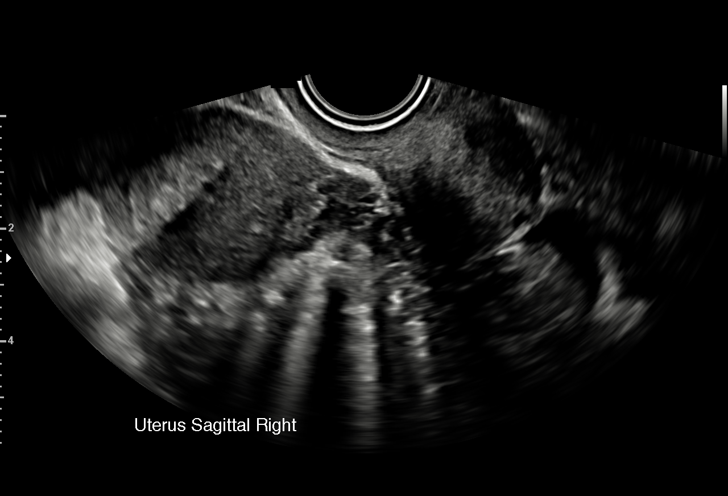
[im 24/46]
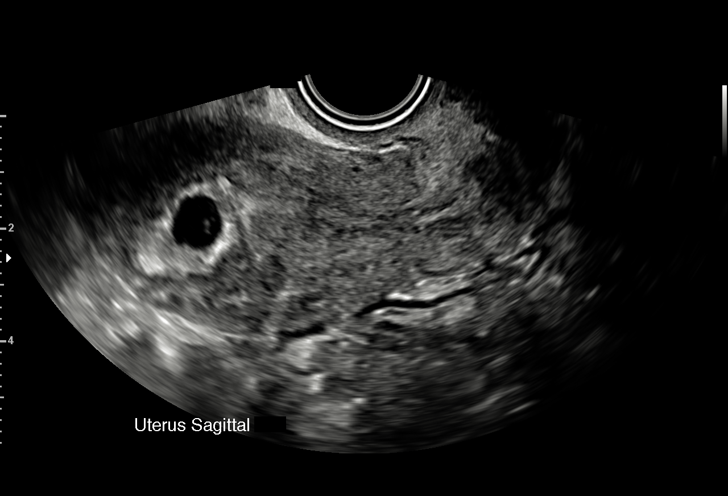
[im 26/46]
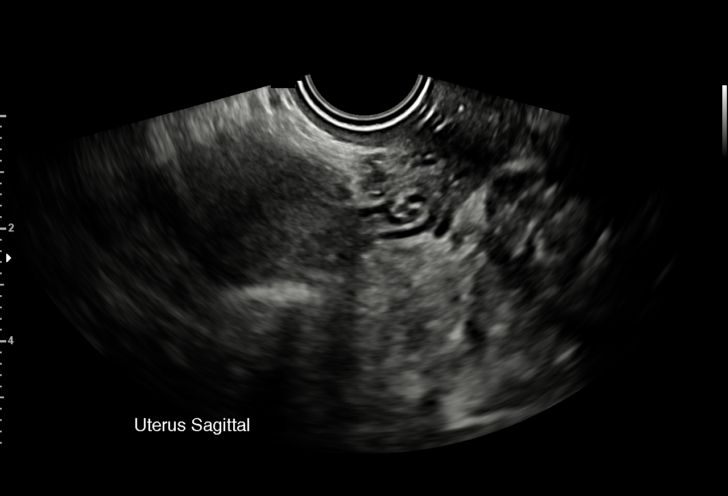
[im 29/46]
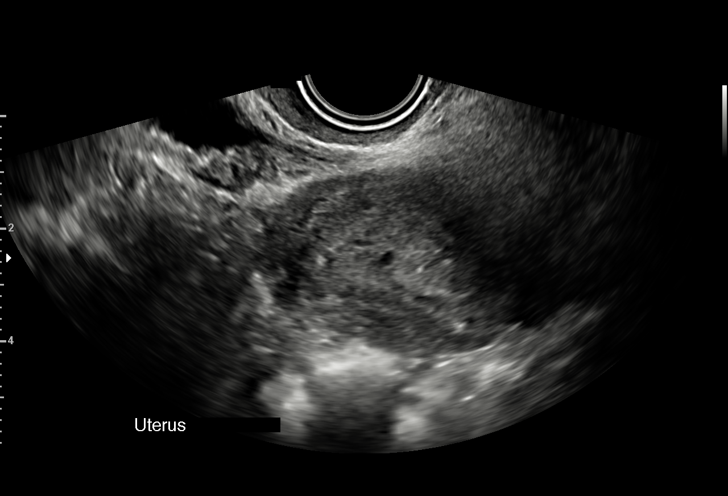
[im 32/46]
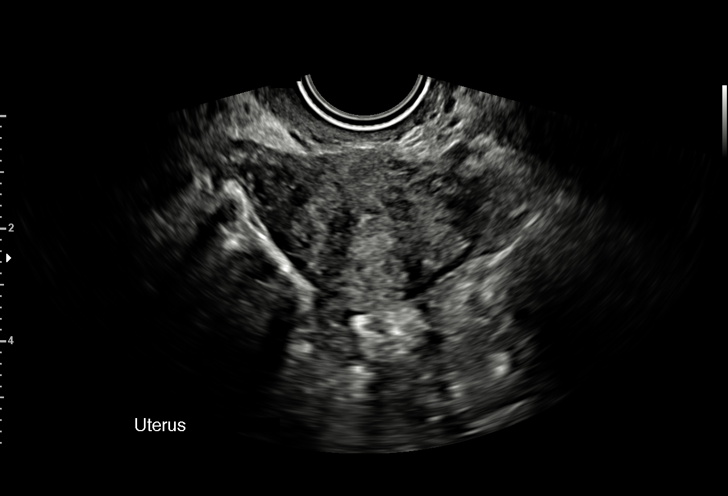
[im 36/46]
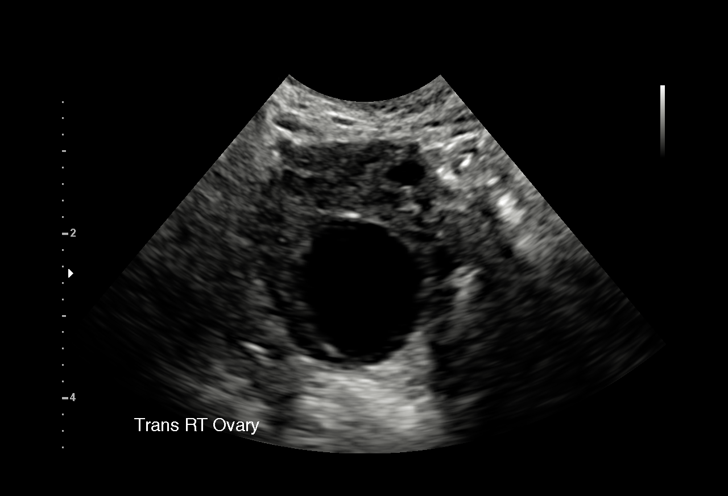
[im 39/46]
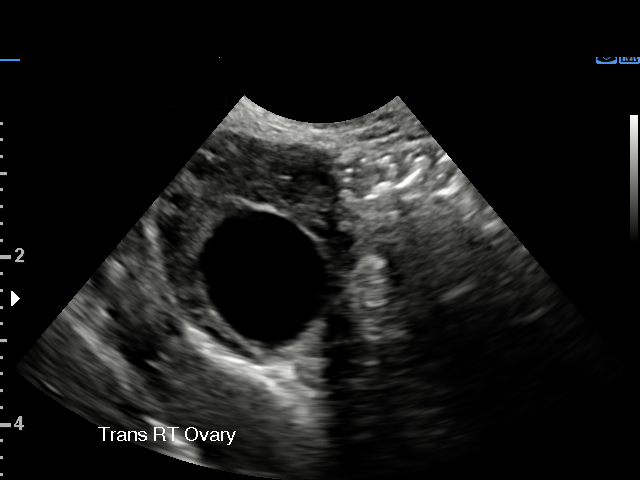
[im 42/46]
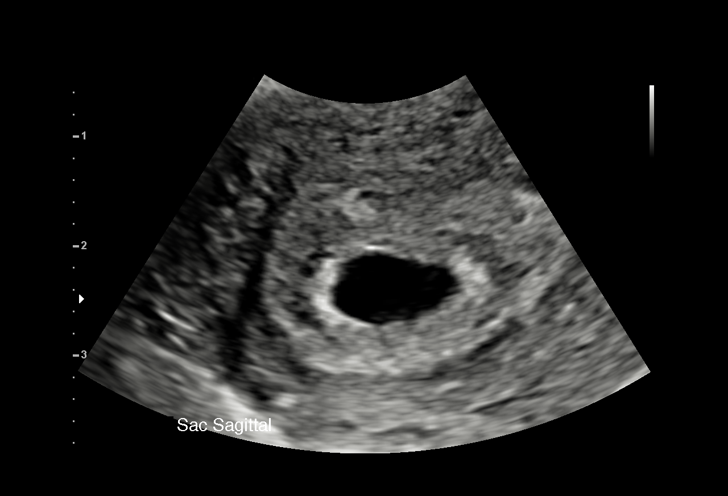
[im 46/46]
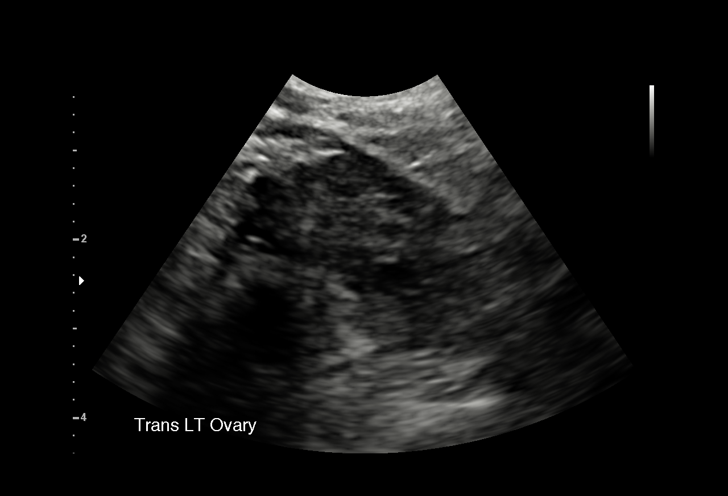

[15 of 28 positions shown; findings below may reference images not displayed]

FINDINGS: Intrauterine gestational sac: Single

Yolk sac:  Not Visualized.

Embryo:  Not Visualized.

Cardiac Activity: Not Visualized.

MSD: 9.7 mm   5 w   5 d

Subchorionic hemorrhage:  None visualized.

Maternal uterus/adnexae: Probable corpus luteum cyst within the
right ovary measuring approximately 1.7 cm in diameter. Left ovary
is unremarkable. Trace free fluid in the pelvis is likely
physiologic.
IMPRESSION: 1. Probable early intrauterine gestational sac, but no yolk sac,
fetal pole, or cardiac activity yet visualized. Recommend follow-up
quantitative B-HCG levels and follow-up US in 14 days to assess
viability. This recommendation follows SRU consensus guidelines:
Diagnostic Criteria for Nonviable Pregnancy Early in the First
Trimester. N Engl J Med 8138; [DATE].
2. Trace pelvic free fluid, likely physiologic.
3. Probable corpus luteum cyst right ovary.

## 2021-05-23 IMAGING — US US MFM OB FOLLOW-UP
1 series · 14 of 28 positions shown · non-contrast
Comparison: none

[Series 1: us mfm ob follow-up · 165 acquisitions, 14 frames shown]
[im 7/165]
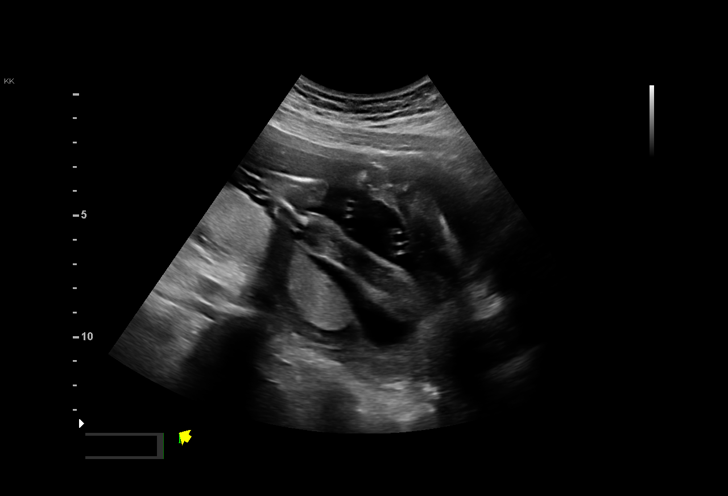
[im 19/165]
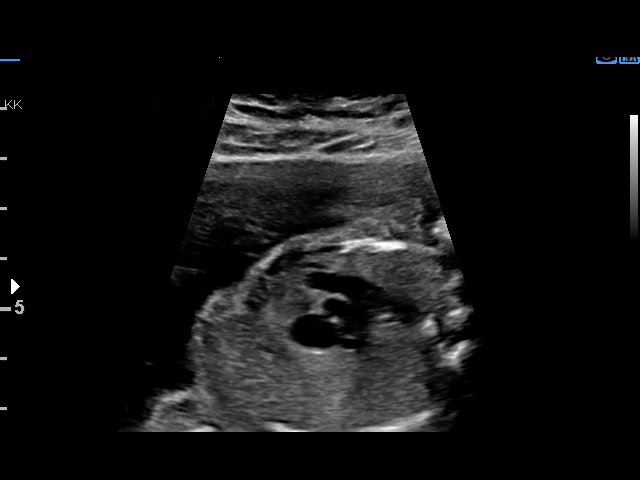
[im 31/165]
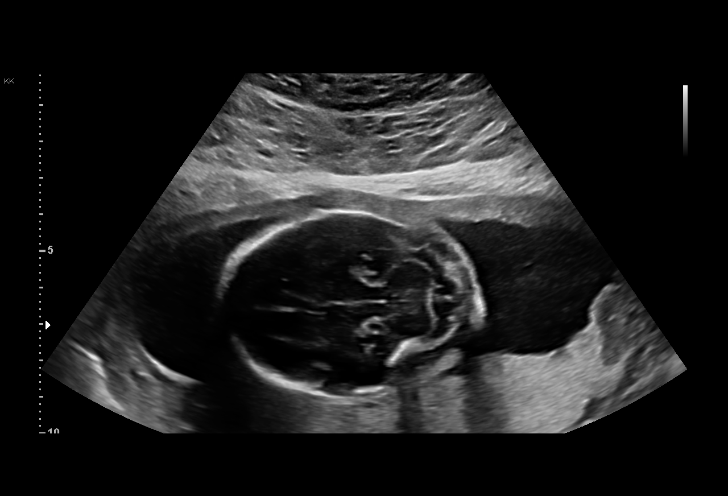
[im 43/165]
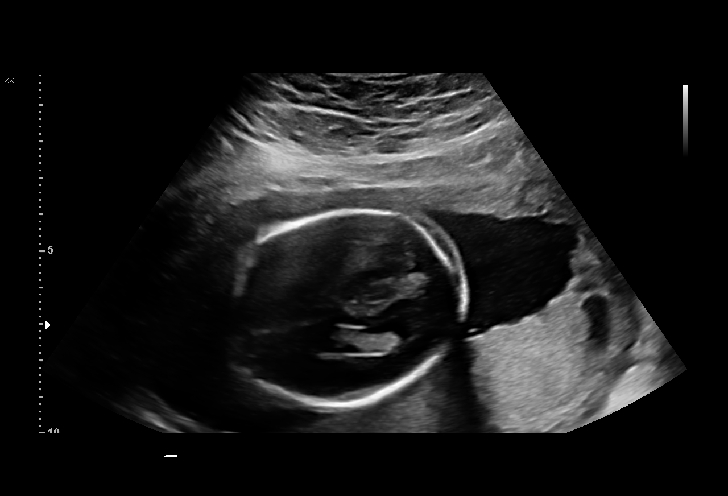
[im 55/165]
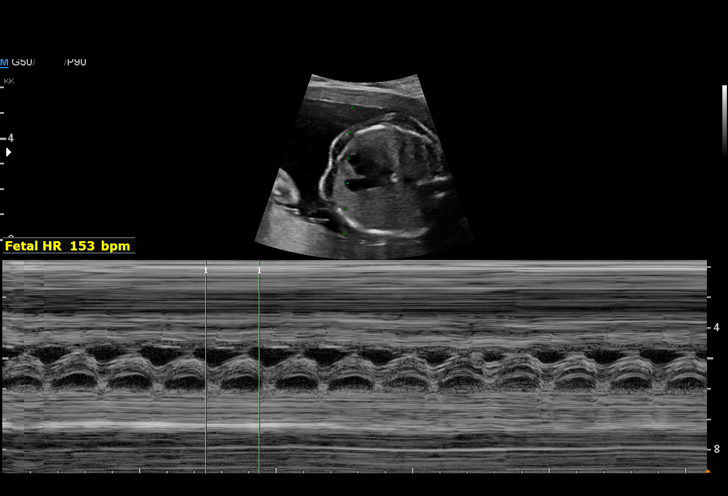
[im 67/165]
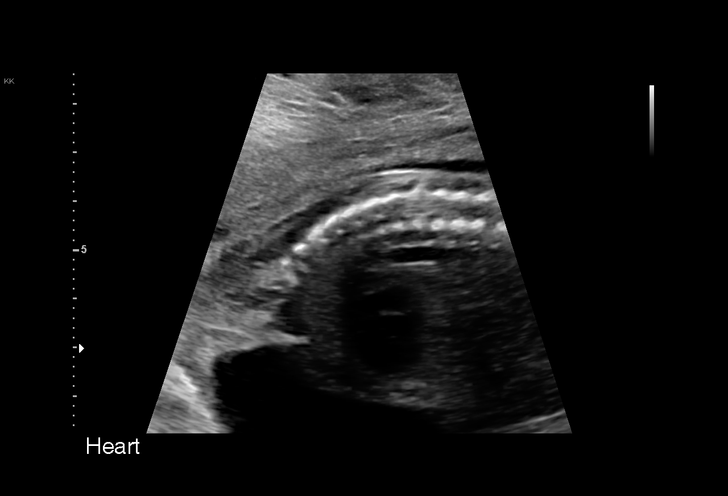
[im 79/165]
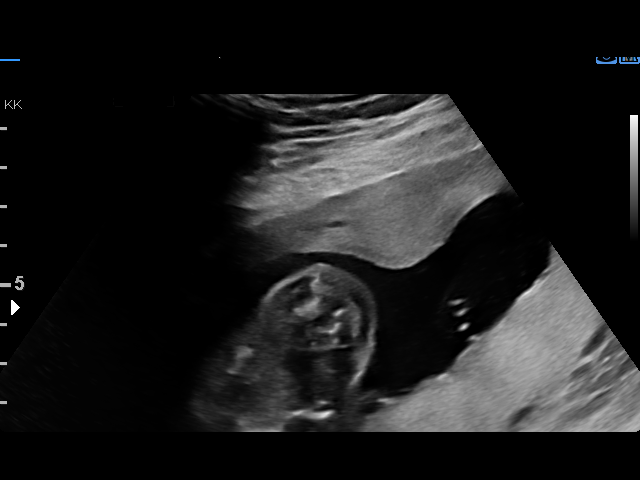
[im 92/165]
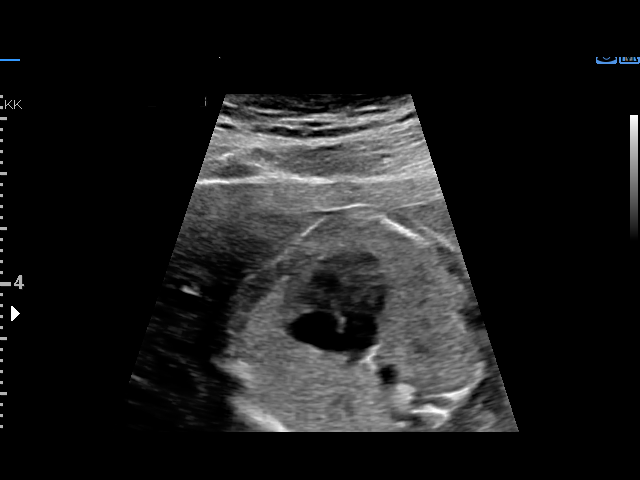
[im 104/165]
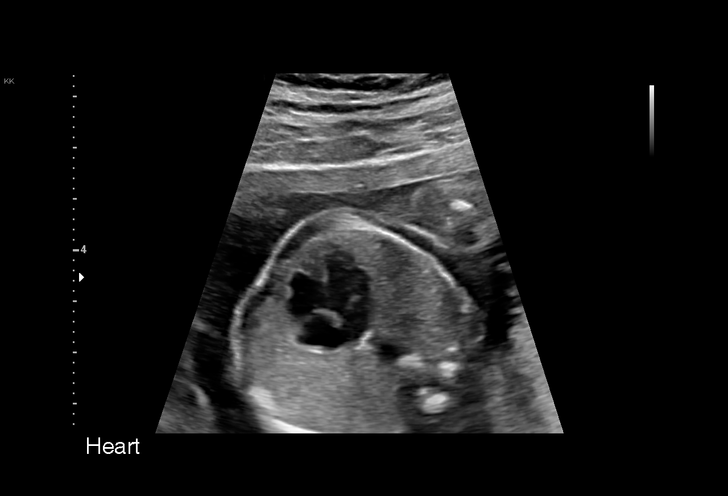
[im 116/165]
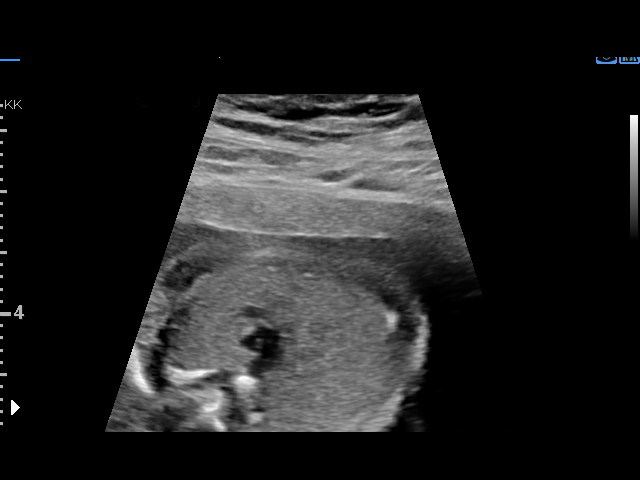
[im 128/165]
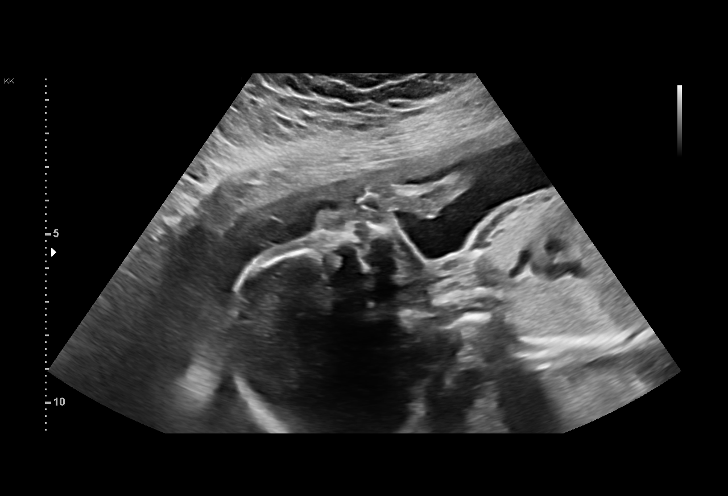
[im 140/165]
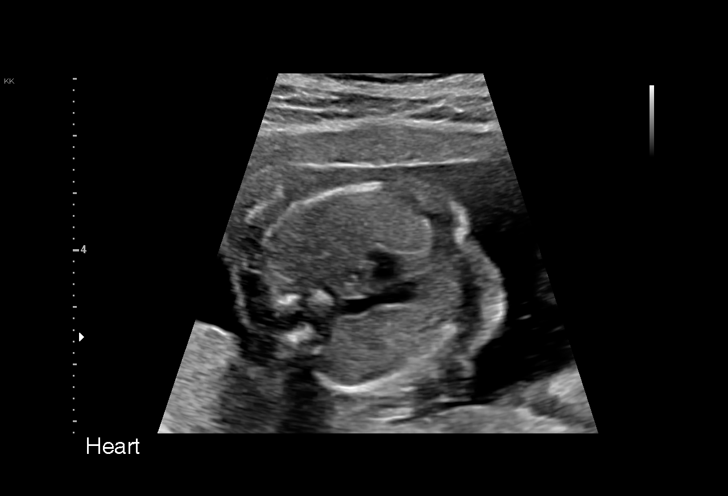
[im 152/165]
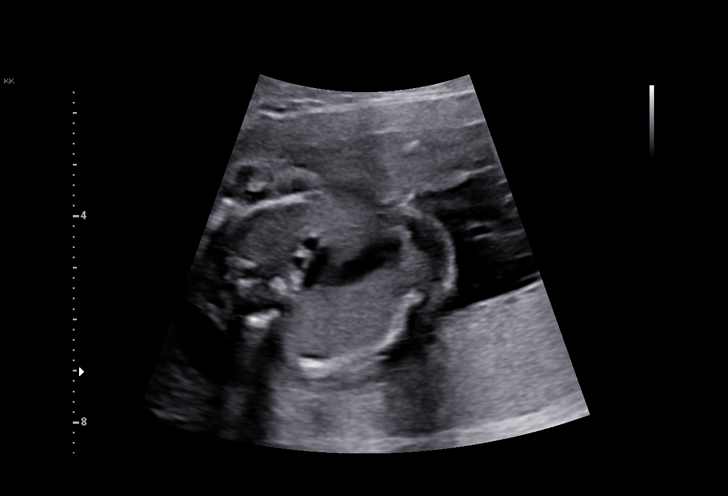
[im 165/165]
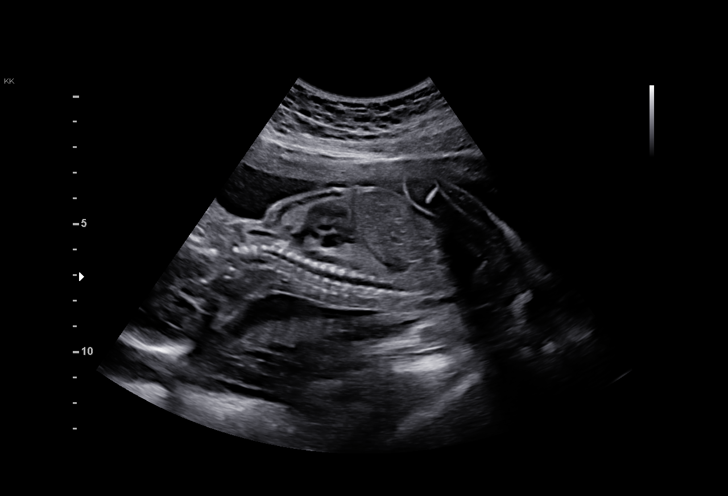

[14 of 28 positions shown; findings below may reference images not displayed]

MONTOYA HUAMAN DO

Indications

 Antenatal follow-up for nonvisualized fetal
 anatomy
 22 weeks gestation of pregnancy
Fetal Evaluation

 Num Of Fetuses:         1
 Fetal Heart Rate(bpm):  143
 Cardiac Activity:       Observed
 Presentation:           Breech
 Placenta:               Posterior
 P. Cord Insertion:      Visualized

 Amniotic Fluid
 AFI FV:      Within normal limits

                             Largest Pocket(cm)

Biometry

 BPD:      51.4  mm     G. Age:  21w 4d         26  %    CI:         68.8   %    70 - 86
                                                         FL/HC:      18.0   %    18.4 -
 HC:       198   mm     G. Age:  22w 0d         31  %    HC/AC:      1.21        1.06 -
 AC:      163.8  mm     G. Age:  21w 3d         21  %    FL/BPD:     69.5   %    71 - 87
 FL:       35.7  mm     G. Age:  21w 2d         15  %    FL/AC:      21.8   %    20 - 24
 CER:      23.5  mm     G. Age:  21w 5d         58  %

 LV:        8.4  mm
 CM:        7.3  mm
 Est. FW:     424  gm    0 lb 15 oz      15  %
OB History

 Gravidity:    1         Term:   0
 Living:       0
Gestational Age

 LMP:           22w 1d        Date:  02/13/20                 EDD:   11/19/20
 U/S Today:     21w 4d                                        EDD:   11/23/20
 Best:          22w 1d     Det. By:  LMP  (02/13/20)          EDD:   11/19/20
Anatomy

 Cranium:               Appears normal         LVOT:                   Appears normal
 Cavum:                 Appears normal         Aortic Arch:            Appears normal
 Ventricles:            Appears normal         Ductal Arch:            Appears normal
 Choroid Plexus:        Appears normal         Diaphragm:              Appears normal
 Cerebellum:            Appears normal         Stomach:                Appears normal, left
                                                                       sided
 Posterior Fossa:       Appears normal         Abdomen:                Appears normal
 Nuchal Fold:           Not applicable (>20    Abdominal Wall:         Appears nml (cord
                        wks GA)                                        insert, abd wall)
 Face:                  Appears normal         Cord Vessels:           Appears normal (3
                        (orbits and profile)                           vessel cord)
 Lips:                  Appears normal         Kidneys:                Appear normal
 Palate:                Appears normal         Bladder:                Appears normal
 Thoracic:              Appears normal         Spine:                  Appears normal
 Heart:                 Appears normal         Upper Extremities:      Appears normal
                        (4CH, axis, and
                        situs)
 RVOT:                  Appears normal         Lower Extremities:      Previously seen
Cervix Uterus Adnexa

 Cervix
 Length:            3.2  cm.
 Normal appearance by transabdominal scan.
Comments

 This patient was seen for a follow up exam as the views of
 the fetal anatomy were unable to be fully visualized during
 her last exam.  She denies any problems since her last exam.
 She was informed that the fetal growth and amniotic fluid
 level appears appropriate for her gestational age.  The overall
 EFW obtained today measures at the 15th percentile for her
 gestational age.  This is consistent with her last exam.
 The views of the fetal anatomy were visualized today.  There
 were no obvious anomalies noted.
 The limitations of ultrasound in the detection of all anomalies
 was discussed.
 As the overall EFW measures in the lower normal range, a
 follow-up growth scan was scheduled in 4 weeks.

## 2021-06-20 IMAGING — US US MFM OB FOLLOW-UP
1 series · 14 of 28 positions shown · non-contrast
Comparison: none

[Series 1: us mfm ob follow-up · 14 of 46 slices shown]
[im 2/46]
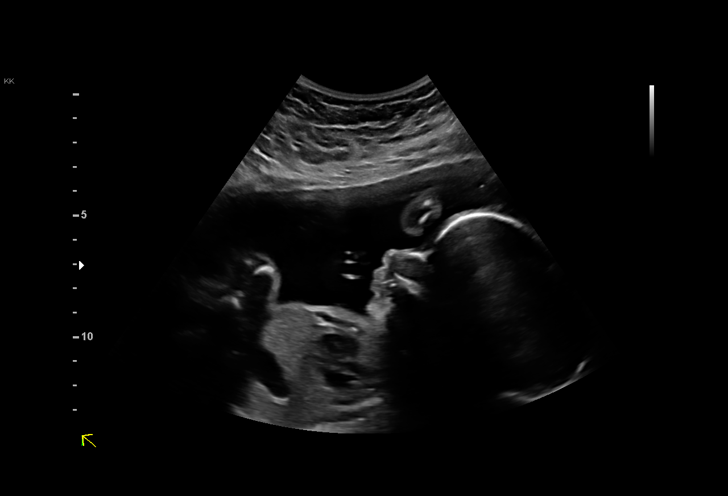
[im 6/46]
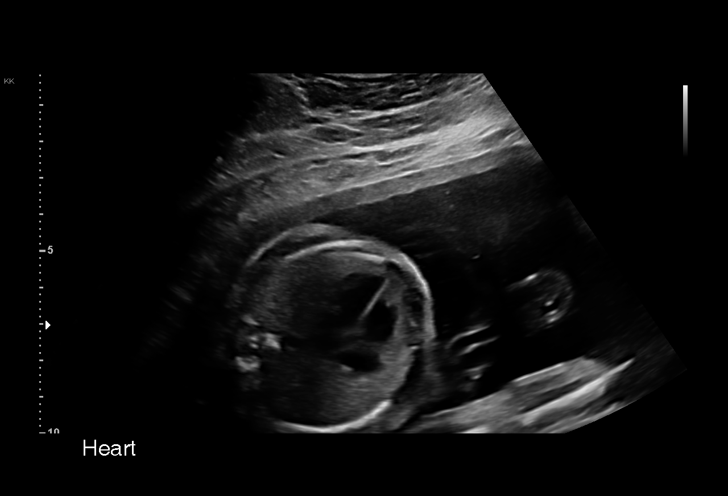
[im 9/46]
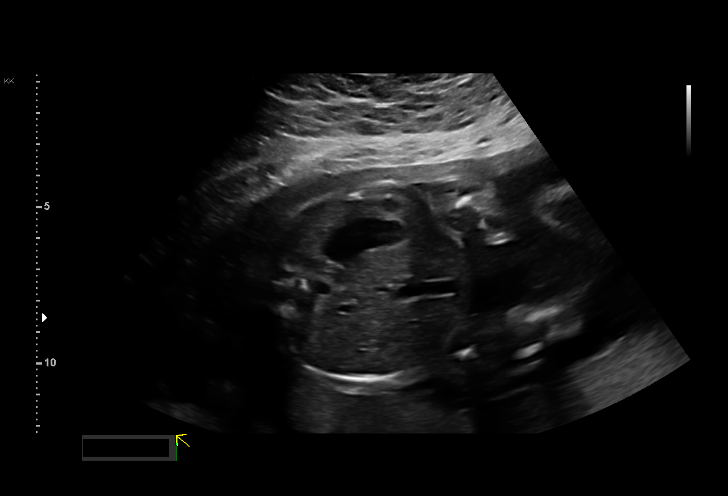
[im 12/46]
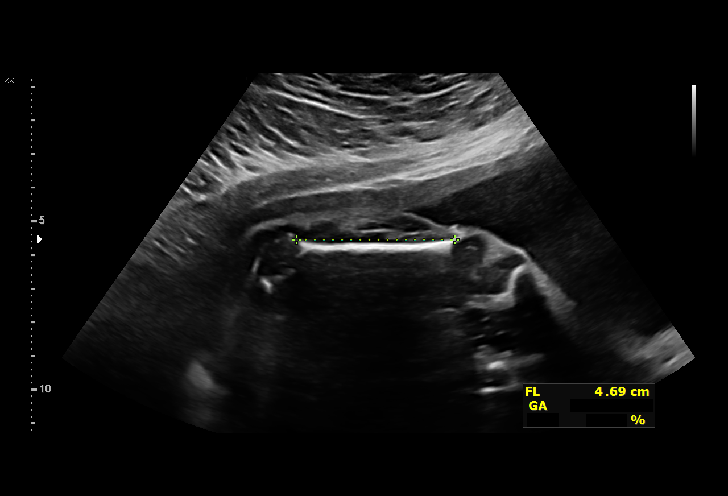
[im 16/46]
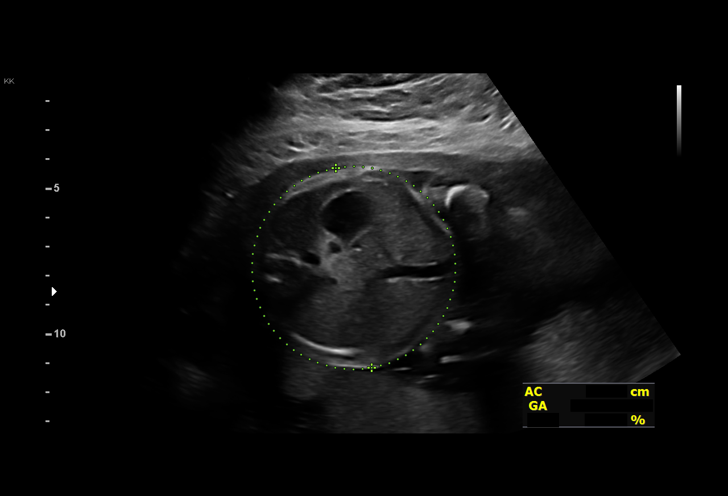
[im 19/46]
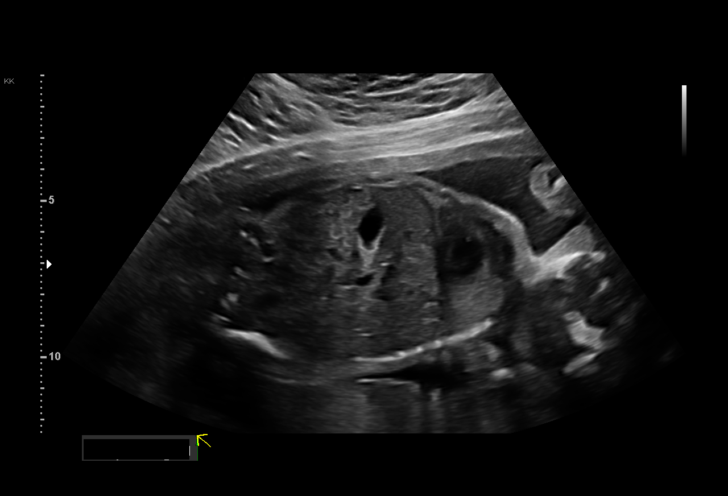
[im 22/46]
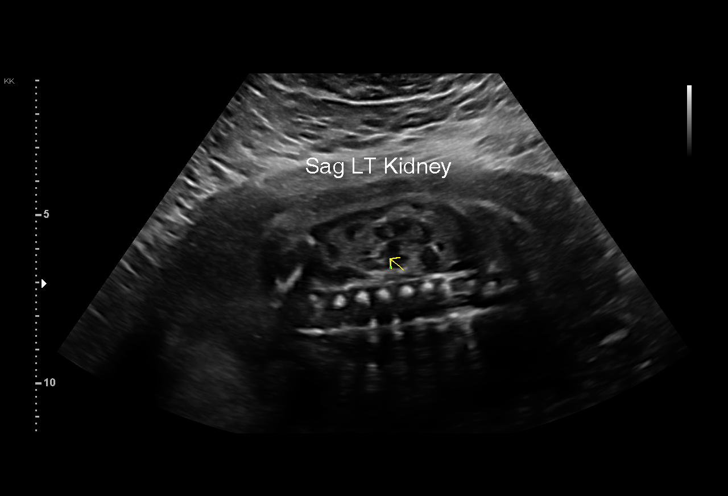
[im 26/46]
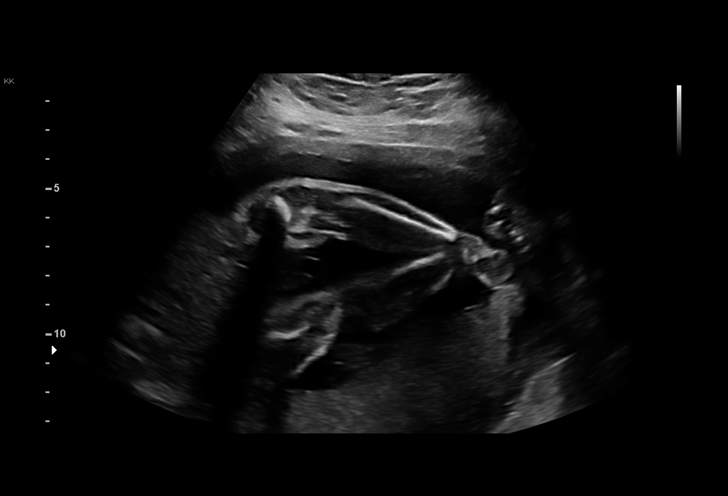
[im 29/46]
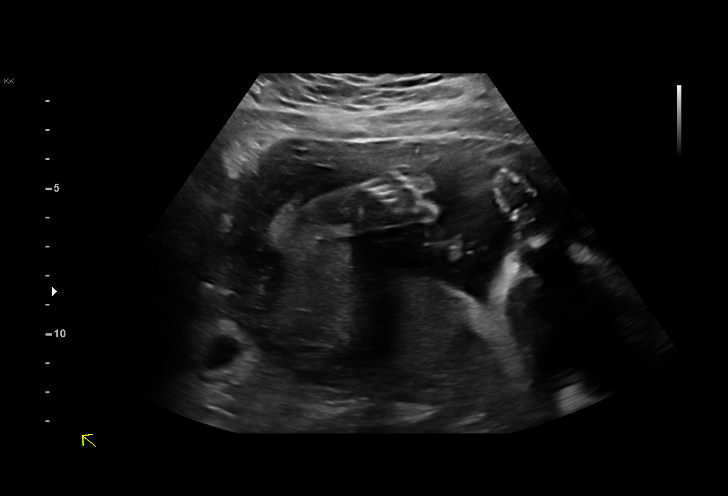
[im 32/46]
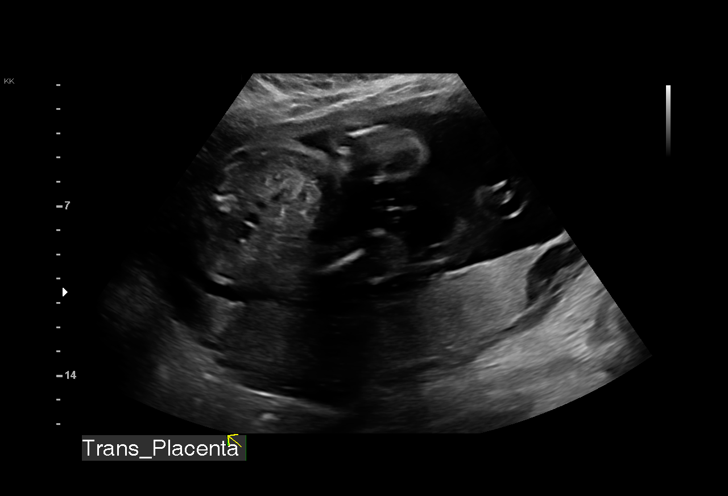
[im 36/46]
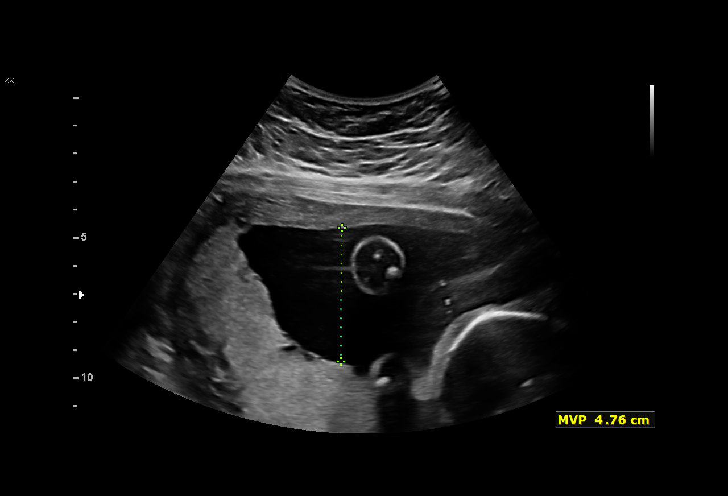
[im 39/46]
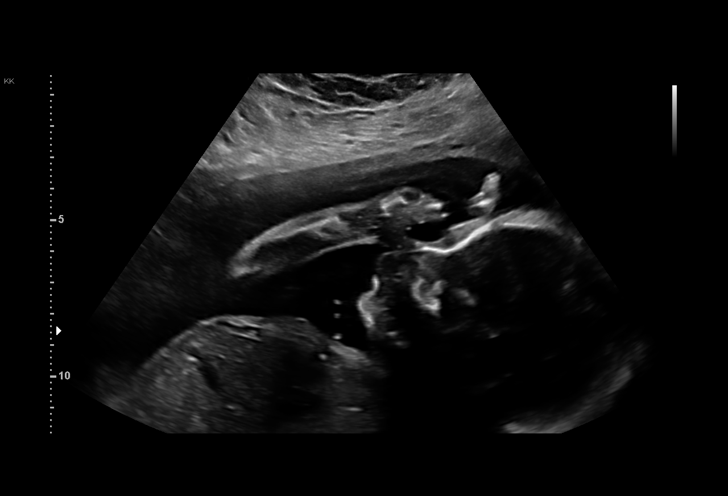
[im 42/46]
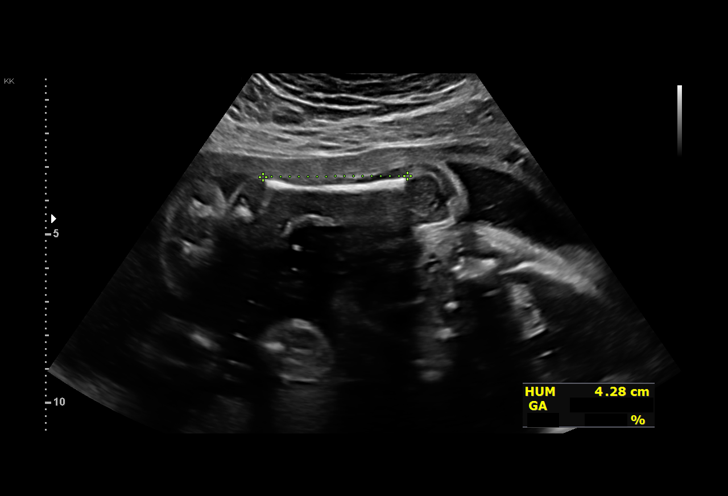
[im 46/46]
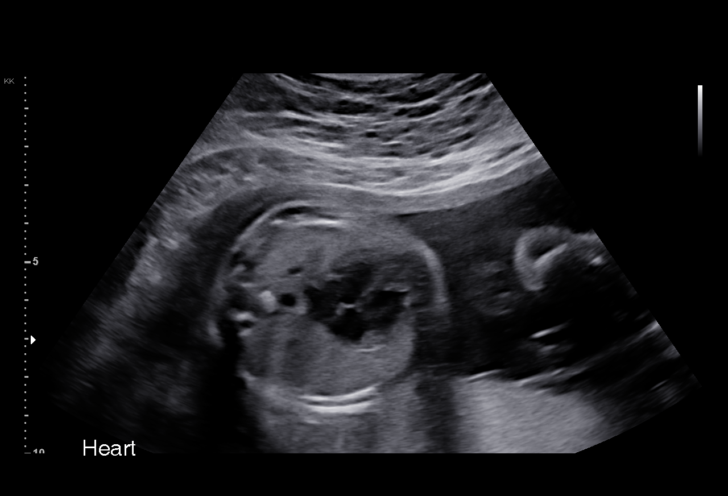

[14 of 28 positions shown; findings below may reference images not displayed]

ERXLEBEN DO

Indications

 26 weeks gestation of pregnancy
 Encounter for other antenatal screening
 follow-up
Fetal Evaluation

 Num Of Fetuses:         1
 Fetal Heart Rate(bpm):  135
 Cardiac Activity:       Observed
 Presentation:           Cephalic
 Placenta:               Posterior
 P. Cord Insertion:      Previously Visualized

 Amniotic Fluid
 AFI FV:      Within normal limits

                             Largest Pocket(cm)

Biometry

 BPD:      66.7  mm     G. Age:  26w 6d         66  %    CI:        76.34   %    70 - 86
                                                         FL/HC:      19.3   %    18.6 -
 HC:      241.9  mm     G. Age:  26w 2d         29  %    HC/AC:      1.13        1.04 -
 AC:      213.8  mm     G. Age:  25w 6d         33  %    FL/BPD:     70.0   %    71 - 87
 FL:       46.7  mm     G. Age:  25w 4d         19  %    FL/AC:      21.8   %    20 - 24
 HUM:      42.8  mm     G. Age:  25w 4d         33  %

 Est. FW:     861  gm    1 lb 14 oz      27  %
OB History

 Gravidity:    1         Term:   0
 Living:       0
Gestational Age

 LMP:           26w 1d        Date:  02/13/20                 EDD:   11/19/20
 U/S Today:     26w 1d                                        EDD:   11/19/20
 Best:          26w 1d     Det. By:  LMP  (02/13/20)          EDD:   11/19/20
Anatomy

 Cranium:               Appears normal         LVOT:                   Previously seen
 Cavum:                 Previously seen        Aortic Arch:            Previously seen
 Ventricles:            Appears normal         Ductal Arch:            Previously seen
 Choroid Plexus:        Previously seen        Diaphragm:              Appears normal
 Cerebellum:            Previously seen        Stomach:                Appears normal, left
                                                                       sided
 Posterior Fossa:       Previously seen        Abdomen:                Appears normal
 Nuchal Fold:           Not applicable (>20    Abdominal Wall:         Previously seen
                        wks GA)
 Face:                  Orbits and profile     Cord Vessels:           Previously seen
                        previously seen
 Lips:                  Appears normal         Kidneys:                Appear normal
 Palate:                Previously seen        Bladder:                Appears normal
 Thoracic:              Appears normal         Spine:                  Previously seen
 Heart:                 Appears normal         Upper Extremities:      Previously seen
                        (4CH, axis, and
                        situs)
 RVOT:                  Previously seen        Lower Extremities:      Previously seen
Cervix Uterus Adnexa

 Cervix
 Not visualized (advanced GA >54wks)
Impression

 Follow up growth was performed today given prior EFW 15%.
 Normal interval growth with measurements consistent with
 dates and improved growth percentage to 27%.
 Good fetal movement and amniotic fluid volume

 I discussed today's examination and its limits. All questions
 were answered
Recommendations

 Follow up growth as clinically indicated.

## 2022-07-19 ENCOUNTER — Telehealth: Payer: BC Managed Care – PPO | Admitting: Family Medicine

## 2022-07-19 DIAGNOSIS — J40 Bronchitis, not specified as acute or chronic: Secondary | ICD-10-CM

## 2022-07-19 MED ORDER — PROMETHAZINE-DM 6.25-15 MG/5ML PO SYRP: 5.0000 mL | ORAL_SOLUTION | Freq: Four times a day (QID) | ORAL | 0 refills | Status: AC | PRN

## 2022-07-19 MED ORDER — AZITHROMYCIN 250 MG PO TABS: ORAL_TABLET | ORAL | 0 refills | Status: AC

## 2022-07-19 NOTE — Patient Instructions (Signed)

## 2022-07-19 NOTE — Progress Notes (Signed)
Virtual Visit Consent   Deanna Maxwell, you are scheduled for a virtual visit with a [facility] provider today. Just as with appointments in the office, your consent must be obtained to participate. Your consent will be active for this visit and any virtual visit you may have with one of our providers in the next 365 days. If you have a MyChart account, a copy of this consent can be sent to you electronically.  As this is a virtual visit, video technology does not allow for your provider to perform a traditional examination. This may limit your provider's ability to fully assess your condition. If your provider identifies any concerns that need to be evaluated in person or the need to arrange testing (such as labs, EKG, etc.), we will make arrangements to do so. Although advances in technology are sophisticated, we cannot ensure that it will always work on either your end or our end. If the connection with a video visit is poor, the visit may have to be switched to a telephone visit. With either a video or telephone visit, we are not always able to ensure that we have a secure connection.  By engaging in this virtual visit, you consent to the provision of healthcare and authorize for your insurance to be billed (if applicable) for the services provided during this visit. Depending on your insurance coverage, you may receive a charge related to this service.  I need to obtain your verbal consent now. Are you willing to proceed with your visit today? Deanna Maxwell has provided verbal consent on 07/19/2022 for a virtual visit (video or telephone). Deanna Curio, FNP  Date: 07/19/2022 9:30 AM  Virtual Visit via Video Note   I, Deanna Maxwell, connected with  Scotlynn Noyes  ([id number], 1991-03-11) on 07/19/22 at  9:15 AM EDT by a video-enabled telemedicine application and verified that I am speaking with the correct person using two identifiers.  Location: Patient: Virtual Visit Location Patient:  Home Provider: Virtual Visit Location Provider: Home Office   I discussed the limitations of evaluation and management by telemedicine and the availability of in person appointments. The patient expressed understanding and agreed to proceed.    History of Present Illness: Deanna Maxwell is a 32 y.o. who identifies as a female who was assigned female at birth, and is being seen today for cough productive at times for over a week worsening. Her daughter started daycare full time. Denies wheezing, sob and fever. Breastfeeds once per day. She will only take prometh once a day.   HPI: HPI  Problems:  Patient Active Problem List   Diagnosis Date Noted   Pregnant and not yet delivered 11/26/2020    Allergies: No Known Allergies Medications:  Current Outpatient Medications:    ibuprofen (ADVIL) 600 MG tablet, Take 1 tablet (600 mg total) by mouth every 6 (six) hours as needed., Disp: 90 tablet, Rfl: 0  Observations/Objective: Patient is well-developed, well-nourished in no acute distress.  Resting comfortably  at home.  Head is normocephalic, atraumatic.  No labored breathing.  Speech is clear and coherent with logical content.  Patient is alert and oriented at baseline.    Assessment and Plan: 1. Bronchitis  Increase fluids, humidifier at night, tylenol, UC if sx worsen.   Follow Up Instructions: I discussed the assessment and treatment plan with the patient. The patient was provided an opportunity to ask questions and all were answered. The patient agreed with the plan and demonstrated an understanding of the instructions.  A copy of instructions were sent to the patient via MyChart unless otherwise noted below.     The patient was advised to call back or seek an in-person evaluation if the symptoms worsen or if the condition fails to improve as anticipated.  Time:  I spent 10 minutes with the patient via telehealth technology discussing the above problems/concerns.    Deanna Curio, FNP

## 2023-04-16 ENCOUNTER — Other Ambulatory Visit: Payer: Self-pay | Admitting: Obstetrics & Gynecology

## 2023-04-16 DIAGNOSIS — N6320 Unspecified lump in the left breast, unspecified quadrant: Secondary | ICD-10-CM

## 2023-04-27 ENCOUNTER — Ambulatory Visit
Admission: RE | Admit: 2023-04-27 | Discharge: 2023-04-27 | Disposition: A | Discharge status: Home / Self Care | Payer: BC Managed Care – PPO | Source: Ambulatory Visit | Attending: Obstetrics & Gynecology | Admitting: Obstetrics & Gynecology

## 2023-04-27 DIAGNOSIS — N6320 Unspecified lump in the left breast, unspecified quadrant: Secondary | ICD-10-CM
